# Patient Record
Sex: Male | Born: 1970
Health system: Southern US, Community
[De-identification: ages and names within clinical notes are randomized; demographics above are authoritative.]

## PROBLEM LIST (undated history)

## (undated) DIAGNOSIS — E785 Hyperlipidemia, unspecified: Secondary | ICD-10-CM

## (undated) DIAGNOSIS — G479 Sleep disorder, unspecified: Secondary | ICD-10-CM

## (undated) HISTORY — PX: OTHER SURGICAL HISTORY: SHX169

## (undated) HISTORY — DX: Sleep disorder, unspecified: G47.9

## (undated) HISTORY — DX: Hyperlipidemia, unspecified: E78.5

---

## 2015-06-09 ENCOUNTER — Inpatient Hospital Stay: Payer: 59 | Attending: Internal Medicine | Admitting: Internal Medicine

## 2015-06-09 ENCOUNTER — Inpatient Hospital Stay: Payer: 59

## 2015-06-09 VITALS — BP 133/84 | HR 81 | Temp 98.4°F | Resp 18 | Ht 70.0 in | Wt 227.3 lb

## 2015-06-09 DIAGNOSIS — Z8042 Family history of malignant neoplasm of prostate: Secondary | ICD-10-CM | POA: Insufficient documentation

## 2015-06-09 DIAGNOSIS — D7589 Other specified diseases of blood and blood-forming organs: Secondary | ICD-10-CM

## 2015-06-09 DIAGNOSIS — E785 Hyperlipidemia, unspecified: Secondary | ICD-10-CM | POA: Diagnosis not present

## 2015-06-09 DIAGNOSIS — R0683 Snoring: Secondary | ICD-10-CM | POA: Diagnosis not present

## 2015-06-09 DIAGNOSIS — D751 Secondary polycythemia: Secondary | ICD-10-CM | POA: Insufficient documentation

## 2015-06-09 DIAGNOSIS — R718 Other abnormality of red blood cells: Secondary | ICD-10-CM | POA: Diagnosis not present

## 2015-06-09 LAB — CBC WITH DIFFERENTIAL/PLATELET
BASOS PCT: 1 %
Basophils Absolute: 0.1 10*3/uL (ref 0–0.1)
EOS ABS: 0.1 10*3/uL (ref 0–0.7)
EOS PCT: 2 %
HCT: 45.1 % (ref 40.0–52.0)
HEMOGLOBIN: 14.2 g/dL (ref 13.0–18.0)
LYMPHS ABS: 2 10*3/uL (ref 1.0–3.6)
Lymphocytes Relative: 33 %
MCH: 22.3 pg — AB (ref 26.0–34.0)
MCHC: 31.5 g/dL — AB (ref 32.0–36.0)
MCV: 70.8 fL — ABNORMAL LOW (ref 80.0–100.0)
MONOS PCT: 8 %
Monocytes Absolute: 0.5 10*3/uL (ref 0.2–1.0)
NEUTROS ABS: 3.5 10*3/uL (ref 1.4–6.5)
NEUTROS PCT: 56 %
PLATELETS: 231 10*3/uL (ref 150–440)
RBC: 6.36 MIL/uL — AB (ref 4.40–5.90)
RDW: 16.6 % — ABNORMAL HIGH (ref 11.5–14.5)
WBC: 6.1 10*3/uL (ref 3.8–10.6)

## 2015-06-09 LAB — SAVE SMEAR

## 2015-06-09 NOTE — Progress Notes (Signed)
Pt here as new consult, no sx from abnormal RBC count.  According to the md notes of referring doctor this has been this way for 3 years

## 2015-06-09 NOTE — Progress Notes (Signed)
Cancer Center @ [facility] Telephone:(336) [phone]  Fax:(336) [phone]     SHAHZAD THOMANN OB: 1971/05/10  MR#: [id number]  AOZ#:[id number]  Patient Care Team: Lyndon Code, MD as PCP - General (Internal Medicine)  CHIEF COMPLAINT: No chief complaint on file.  microcytosis   No history exists.    No flowsheet data found.  HISTORY OF PRESENT ILLNESS:   Mr. Sauceda is a 44   year old male, who is referred to our clinic for evaluation of microcytosis. He he feels well, has no complaints, has not had any bleeding from any source, maintains good diet, not a vegetarian. He recalls that approximately 4 years ago he was told that his red blood cells was small, but no particular workup was done. Review of available medical records suggest that his MCV is around 70 fL, with most recent iron profile being within normal range. He is not anemic, but his red blood cell count is elevated. He does not recall anybody in his family suffering from anemia, sickle cell disease, thalassemia.   he snores at nighttime, and will have sleep study within next few weeks.  REVIEW OF SYSTEMS:   Review of Systems  All other systems reviewed and are negative.    PAST MEDICAL HISTORY: Past Medical History  Diagnosis Date  . Hyperlipidemia   . Sleep disorder     PAST SURGICAL HISTORY: History reviewed. No pertinent past surgical history.  FAMILY HISTORY Family History  Problem Relation Age of Onset  . Diabetes Mother   . Thyroid disease Mother   . Prostate cancer Maternal Uncle   . Diabetes Maternal Grandfather    no history of anemia, thalassemia, sickle cell disorder  ADVANCED DIRECTIVES:  No flowsheet data found.  HEALTH MAINTENANCE: Social History  Substance Use Topics  . Smoking status: Never Smoker   . Smokeless tobacco: Never Used  . Alcohol Use: None     No Known Allergies  Current Outpatient Prescriptions  Medication Sig Dispense Refill  . HYDROcodone-homatropine (HYCODAN) 5-1.5 MG/5ML  syrup Take by mouth.     No current facility-administered medications for this visit.    OBJECTIVE:  Filed Vitals:   06/09/15 1056  BP: 133/84  Pulse: 81  Temp: 98.4 F (36.9 C)  Resp: 18     Body mass index is 32.61 kg/(m^2).    ECOG FS:0 - Asymptomatic  Physical Exam  Constitutional: He is oriented to person, place, and time and well-developed, well-nourished, and in no distress. No distress.  African-American male  HENT:  Head: Normocephalic and atraumatic.  Right Ear: External ear normal.  Left Ear: External ear normal.  Nose: Nose normal.  Mouth/Throat: Oropharynx is clear and moist. No oropharyngeal exudate.  Eyes: Conjunctivae and EOM are normal. Pupils are equal, round, and reactive to light. Right eye exhibits no discharge. Left eye exhibits no discharge. No scleral icterus.  Neck: Normal range of motion. Neck supple. No JVD present. No tracheal deviation present. No thyromegaly present.  Cardiovascular: Normal rate, regular rhythm, normal heart sounds and intact distal pulses.  Exam reveals no gallop and no friction rub.   No murmur heard. Pulmonary/Chest: Effort normal and breath sounds normal. No stridor. No respiratory distress. He has no wheezes. He has no rales. He exhibits no tenderness.  Abdominal: Soft. Bowel sounds are normal. He exhibits no distension and no mass. There is no tenderness. There is no rebound and no guarding.  Genitourinary:  Patient deferred  Musculoskeletal: Normal range of motion. He exhibits no  edema or tenderness.  Lymphadenopathy:    He has no cervical adenopathy.  Neurological: He is alert and oriented to person, place, and time. He has normal reflexes. No cranial nerve deficit. He exhibits normal muscle tone. Gait normal. Coordination normal. GCS score is 15.  Skin: Skin is warm and dry. No rash noted. He is not diaphoretic. No erythema. No pallor.  Psychiatric: Mood, memory, affect and judgment normal.  Nursing note and vitals  reviewed.    LAB RESULTS:  Recent Results (from the past 2160 hour(s))  CBC with Differential/Platelet     Status: Abnormal   Collection Time: 06/09/15 11:30 AM  Result Value Ref Range   WBC 6.1 3.8 - 10.6 K/uL   RBC 6.36 (H) 4.40 - 5.90 MIL/uL   Hemoglobin 14.2 13.0 - 18.0 g/dL   HCT 16.145.1 09.640.0 - 04.552.0 %   MCV 70.8 (L) 80.0 - 100.0 fL   MCH 22.3 (L) 26.0 - 34.0 pg   MCHC 31.5 (L) 32.0 - 36.0 g/dL   RDW 40.916.6 (H) 81.111.5 - 91.414.5 %   Platelets 231 150 - 440 K/uL   Neutrophils Relative % 56 %   Neutro Abs 3.5 1.4 - 6.5 K/uL   Lymphocytes Relative 33 %   Lymphs Abs 2.0 1.0 - 3.6 K/uL   Monocytes Relative 8 %   Monocytes Absolute 0.5 0.2 - 1.0 K/uL   Eosinophils Relative 2 %   Eosinophils Absolute 0.1 0 - 0.7 K/uL   Basophils Relative 1 %   Basophils Absolute 0.1 0 - 0.1 K/uL     ASSESSMENT AND MEDICAL DECISION MAKING:   microcytosis-the patient is currently completely asymptomatic, with the only abnormality being microcytosis with MCV of 70.8, and mild erythrocytosis with red blood cell count of 6.36, but normal hemoglobin. Most likely cause of microcytosis in this case is thalassemia trait, likely beta thalassemia. We will check hemoglobin electrophoresis, which should give us a hint of weight for symmetric if it is present area if it were to return is normal, we will check Mr. Jennette Kettleeal for alpha thalassemia. We will also check copper, zinc and lead levels, which on rare occasions could cause microcytosis in the absence of anemia. He'll return to our clinic in 2 weeks.   Patient expressed understanding and was in agreement with this plan. He also understands that He can call clinic at any time with any questions, concerns, or complaints.    No matching staging information was found for the patient.  Gorden Harmsmitriy Prentiss Polio, MD   06/09/2015 10:29 AM

## 2015-06-12 LAB — COPPER, SERUM: COPPER: 90 ug/dL (ref 72–166)

## 2015-06-12 LAB — HEMOGLOBINOPATHY EVALUATION
HGB C: 0 %
Hgb A2 Quant: 6.3 % — ABNORMAL HIGH (ref 0.7–3.1)
Hgb A: 92.2 % — ABNORMAL LOW (ref 94.0–98.0)
Hgb F Quant: 1.5 % (ref 0.0–2.0)
Hgb S Quant: 0 %

## 2015-06-12 LAB — LEAD, BLOOD (ADULT >= 16 YRS): Lead-Whole Blood: NOT DETECTED ug/dL (ref 0–19)

## 2015-06-12 LAB — ZINC: Zinc: 74 ug/dL (ref 56–134)

## 2015-06-23 ENCOUNTER — Inpatient Hospital Stay: Payer: 59

## 2015-07-07 ENCOUNTER — Inpatient Hospital Stay: Payer: 59 | Attending: Internal Medicine | Admitting: Internal Medicine

## 2015-07-07 ENCOUNTER — Encounter: Payer: Self-pay | Admitting: Internal Medicine

## 2015-07-07 VITALS — BP 148/88 | HR 85 | Temp 97.7°F | Resp 18 | Wt 223.1 lb

## 2015-07-07 DIAGNOSIS — D7589 Other specified diseases of blood and blood-forming organs: Secondary | ICD-10-CM | POA: Insufficient documentation

## 2015-07-07 DIAGNOSIS — G479 Sleep disorder, unspecified: Secondary | ICD-10-CM | POA: Diagnosis not present

## 2015-07-07 DIAGNOSIS — D563 Thalassemia minor: Secondary | ICD-10-CM | POA: Diagnosis not present

## 2015-07-07 DIAGNOSIS — E785 Hyperlipidemia, unspecified: Secondary | ICD-10-CM

## 2015-07-07 DIAGNOSIS — Z8042 Family history of malignant neoplasm of prostate: Secondary | ICD-10-CM | POA: Diagnosis not present

## 2015-07-07 DIAGNOSIS — Z79899 Other long term (current) drug therapy: Secondary | ICD-10-CM | POA: Diagnosis not present

## 2015-07-07 NOTE — Progress Notes (Signed)
[facility] @ [facility] Telephone:(336) [phone]  Fax:(336) [phone]     Noah DrainDouglas J Cummings OB: 05/13/1971  MR#: [id number]  EXB#:[id number]#:[id number]  Patient Care Team: Lyndon CodeFozia M Khan, MD as PCP - General (Internal Medicine)  CHIEF COMPLAINT:  Chief Complaint  Patient presents with  . macrocytosis   microcytosis, beta thalassemia trait   No history exists.    No flowsheet data found.  HISTORY OF PRESENT ILLNESS:   Noah Cummings is a 45   year old male, who is referred to our clinic for evaluation of microcytosis. He he feels well, has no complaints, has not had any bleeding from any source, maintains good diet, not a vegetarian. He recalls that approximately 4 years ago he was told that his red blood cells was small, but no particular workup was done. Review of available medical records suggest that his MCV is around 70 fL, with most recent iron profile being within normal range. He is not anemic, but his red blood cell count is elevated. He does not recall anybody in his family suffering from anemia, sickle cell disease, thalassemia.   he snores at nighttime, and will have sleep study within next few weeks.  Current status: Noah Cummings returns to our clinic for a follow-up visit to discuss the results of the workup. He does not have any complaints at this time.  REVIEW OF SYSTEMS:   Review of Systems  All other systems reviewed and are negative.    PAST MEDICAL HISTORY: Past Medical History  Diagnosis Date  . Hyperlipidemia   . Sleep disorder     PAST SURGICAL HISTORY: History reviewed. No pertinent past surgical history.  FAMILY HISTORY Family History  Problem Relation Age of Onset  . Diabetes Mother   . Thyroid disease Mother   . Prostate cancer Maternal Uncle   . Diabetes Maternal Grandfather    no history of anemia, thalassemia, sickle cell disorder  ADVANCED DIRECTIVES:  No flowsheet data found.  HEALTH MAINTENANCE: Social History  Substance Use Topics  . Smoking status: Never  Smoker   . Smokeless tobacco: Never Used  . Alcohol Use: None     No Known Allergies  Current Outpatient Prescriptions  Medication Sig Dispense Refill  . chlorpheniramine-HYDROcodone (TUSSIONEX) 10-8 MG/5ML SUER Take 5 mLs by mouth 2 (two) times daily.    Marland Kitchen. doxycycline (VIBRAMYCIN) 100 MG capsule Take 1 capsule by mouth 2 (two) times daily.    . predniSONE (DELTASONE) 5 MG tablet Taper 6-5-4-3-2-1     No current facility-administered medications for this visit.    OBJECTIVE:  Filed Vitals:   07/07/15 1116  BP: 148/88  Pulse: 85  Temp: 97.7 F (36.5 C)  Resp: 18     Body mass index is 32.01 kg/(m^2).    ECOG FS:0 - Asymptomatic  Physical Exam  Constitutional: He is oriented to person, place, and time and well-developed, well-nourished, and in no distress. No distress.  African-American male  HENT:  Head: Normocephalic and atraumatic.  Right Ear: External ear normal.  Left Ear: External ear normal.  Nose: Nose normal.  Mouth/Throat: Oropharynx is clear and moist. No oropharyngeal exudate.  Eyes: Conjunctivae and EOM are normal. Pupils are equal, round, and reactive to light. Right eye exhibits no discharge. Left eye exhibits no discharge. No scleral icterus.  Neck: Normal range of motion. Neck supple. No JVD present. No tracheal deviation present. No thyromegaly present.  Cardiovascular: Normal rate, regular rhythm, normal heart sounds and intact distal pulses.  Exam reveals no  gallop and no friction rub.   No murmur heard. Pulmonary/Chest: Effort normal and breath sounds normal. No stridor. No respiratory distress. He has no wheezes. He has no rales. He exhibits no tenderness.  Abdominal: Soft. Bowel sounds are normal. He exhibits no distension and no mass. There is no tenderness. There is no rebound and no guarding.  Genitourinary:  Patient deferred  Musculoskeletal: Normal range of motion. He exhibits no edema or tenderness.  Lymphadenopathy:    He has no cervical  adenopathy.  Neurological: He is alert and oriented to person, place, and time. He has normal reflexes. No cranial nerve deficit. He exhibits normal muscle tone. Gait normal. Coordination normal. GCS score is 15.  Skin: Skin is warm and dry. No rash noted. He is not diaphoretic. No erythema. No pallor.  Psychiatric: Mood, memory, affect and judgment normal.  Nursing note and vitals reviewed.    LAB RESULTS:  Recent Results (from the past 2160 hour(s))  Lead, blood (adult age 50 yrs or greater)     Status: None   Collection Time: 06/09/15 11:30 AM  Result Value Ref Range   Lead-Whole Blood None Detected 0 - 19 ug/dL    Comment: (NOTE)                          Environmental Exposure:                           WHO Recommendation    <20                          Occupational Exposure:                           OSHA Lead Std          40                           BEI                    30                                Detection Limit =  1 Performed At: [facility] [address] [place], [place] [id number] Mila Homer MD WU:[id number]   Copper, serum     Status: None   Collection Time: 06/09/15 11:30 AM  Result Value Ref Range   Copper 90 72 - 166 ug/dL    Comment: (NOTE)                                Detection Limit = 5 Performed At: [facility] [address] [place], [place] [id number] Mila Homer MD VH:[id number]   Save smear     Status: None   Collection Time: 06/09/15 11:30 AM  Result Value Ref Range   Smear Review SMEAR STAINED AND AVAILABLE FOR REVIEW   CBC with Differential/Platelet     Status: Abnormal   Collection Time: 06/09/15 11:30 AM  Result Value Ref Range   WBC 6.1 3.8 - 10.6 K/uL   RBC 6.36 (H) 4.40 - 5.90 MIL/uL   Hemoglobin 14.2 13.0 - 18.0 g/dL   HCT  45.1 40.0 - 52.0 %   MCV 70.8 (L) 80.0 - 100.0 fL   MCH 22.3 (L) 26.0 - 34.0 pg   MCHC 31.5 (L) 32.0 - 36.0 g/dL   RDW 95.6 (H) 21.3 - 08.6 %   Platelets 231 150 - 440 K/uL    Neutrophils Relative % 56 %   Neutro Abs 3.5 1.4 - 6.5 K/uL   Lymphocytes Relative 33 %   Lymphs Abs 2.0 1.0 - 3.6 K/uL   Monocytes Relative 8 %   Monocytes Absolute 0.5 0.2 - 1.0 K/uL   Eosinophils Relative 2 %   Eosinophils Absolute 0.1 0 - 0.7 K/uL   Basophils Relative 1 %   Basophils Absolute 0.1 0 - 0.1 K/uL  Hemoglobinopathy evaluation     Status: Abnormal   Collection Time: 06/09/15 11:30 AM  Result Value Ref Range   Hgb A2 Quant 6.3 (H) 0.7 - 3.1 %   Hgb F Quant 1.5 0.0 - 2.0 %   Hgb S Quant 0.0 0.0 %   Hgb C 0.0 0.0 %   Hgb A 92.2 (L) 94.0 - 98.0 %   Please Note: Comment     Comment: (NOTE) Hemoglobin pattern and concentrations are consistent with beta- Thalassemia minor. Suggest hematologic and clinical correlation. Performed At: [facility] [address] [place], [place] [id number] Mila Homer MD BM:[id number]   Zinc     Status: None   Collection Time: 06/09/15 11:30 AM  Result Value Ref Range   Zinc 74 56 - 134 ug/dL    Comment: (NOTE)                                Detection Limit = 5 Performed At: [facility] [address] [place], [place] [id number] Mila Homer MD IH:[id number]      ASSESSMENT AND MEDICAL DECISION MAKING:  Beta thalassemia trait-this we discussed during the first visit, the workup was consistent with beta thalassemia trait AKA beta thalassemia minor. We had a discussion with Mr. Coulthard, during which we underlined the fact that such a diagnosis does not carry any health-related consequences for him, and does not require any treatment or monitoring. However, this diagnosis has some implications for his 5 children. We discussed the genetics of beta thalassemia, and explained to Mr. Coward that his children have a 50% chance of acquiring beta thalassemia trait from him if his partner does not have any blood abnormalities, and have a 25% chance of inheriting 2 defective genes from the parents if both of them have beta  thalassemia trait, as such developing a full-blown thalassemia. There is also a possibility that if Mr. Torbert partner had another hemoglobinopathy or sickling disorder, a different combination could appear him on his children. He expressed understanding of these concepts, and stated that he would discuss this information with his children and his children's mothers, to decide on whether any additional evaluation of his children is needed.  He, however, does not require any follow-up appointment with [place].  Patient expressed understanding and was in agreement with this plan. He also understands that He can call clinic at any time with any questions, concerns, or complaints.    No matching staging information was found for the patient.  Gorden Harms, MD   07/07/2015 12:37 PM

## 2016-07-31 DIAGNOSIS — R030 Elevated blood-pressure reading, without diagnosis of hypertension: Secondary | ICD-10-CM | POA: Diagnosis not present

## 2016-07-31 DIAGNOSIS — Z0001 Encounter for general adult medical examination with abnormal findings: Secondary | ICD-10-CM | POA: Diagnosis not present

## 2016-08-18 ENCOUNTER — Encounter: Payer: Self-pay | Admitting: Emergency Medicine

## 2016-08-18 ENCOUNTER — Emergency Department
Admission: EM | Admit: 2016-08-18 | Discharge: 2016-08-19 | Disposition: A | Discharge status: Home / Self Care | Payer: 59 | Attending: Emergency Medicine | Admitting: Emergency Medicine

## 2016-08-18 DIAGNOSIS — X500XXA Overexertion from strenuous movement or load, initial encounter: Secondary | ICD-10-CM | POA: Diagnosis not present

## 2016-08-18 DIAGNOSIS — S39012A Strain of muscle, fascia and tendon of lower back, initial encounter: Secondary | ICD-10-CM

## 2016-08-18 DIAGNOSIS — Y9389 Activity, other specified: Secondary | ICD-10-CM | POA: Diagnosis not present

## 2016-08-18 DIAGNOSIS — Y929 Unspecified place or not applicable: Secondary | ICD-10-CM | POA: Diagnosis not present

## 2016-08-18 DIAGNOSIS — R0789 Other chest pain: Secondary | ICD-10-CM | POA: Insufficient documentation

## 2016-08-18 DIAGNOSIS — S3992XA Unspecified injury of lower back, initial encounter: Secondary | ICD-10-CM | POA: Diagnosis present

## 2016-08-18 DIAGNOSIS — Y990 Civilian activity done for income or pay: Secondary | ICD-10-CM | POA: Diagnosis not present

## 2016-08-18 DIAGNOSIS — R079 Chest pain, unspecified: Secondary | ICD-10-CM

## 2016-08-18 MED ORDER — ORPHENADRINE CITRATE 30 MG/ML IJ SOLN
60.0000 mg | INTRAMUSCULAR | Status: AC
  Administered 2016-08-18: 60 mg via INTRAMUSCULAR
  Filled 2016-08-18: qty 2

## 2016-08-18 MED ORDER — KETOROLAC TROMETHAMINE 60 MG/2ML IM SOLN
30.0000 mg | Freq: Once | INTRAMUSCULAR | Status: AC
  Administered 2016-08-18: 30 mg via INTRAMUSCULAR
  Filled 2016-08-18: qty 2

## 2016-08-18 NOTE — ED Triage Notes (Signed)
Pt c/o low back pain since Wednesday that is progressively worsening; denies injury; pt says he does heavy lifting and repetitive movement at his job and was working when pain started; denies urinary s/s;

## 2016-08-18 NOTE — ED Notes (Signed)
Pt states approx 1 week ago began having lower back tightness while at work, states pain has progressively gotten worse. Pt states for work he lifts boxes and has repeated motions involving his lower back.

## 2016-08-19 ENCOUNTER — Emergency Department: Payer: 59

## 2016-08-19 DIAGNOSIS — R0789 Other chest pain: Secondary | ICD-10-CM | POA: Diagnosis not present

## 2016-08-19 LAB — BASIC METABOLIC PANEL
ANION GAP: 6 (ref 5–15)
BUN: 15 mg/dL (ref 6–20)
CO2: 27 mmol/L (ref 22–32)
Calcium: 8.8 mg/dL — ABNORMAL LOW (ref 8.9–10.3)
Chloride: 102 mmol/L (ref 101–111)
Creatinine, Ser: 1.36 mg/dL — ABNORMAL HIGH (ref 0.61–1.24)
GFR calc Af Amer: >60 mL/min (ref >60)
GFR calc non Af Amer: >60 mL/min (ref >60)
GLUCOSE: 102 mg/dL — AB (ref 65–99)
POTASSIUM: 4.2 mmol/L (ref 3.5–5.1)
Sodium: 135 mmol/L (ref 135–145)

## 2016-08-19 LAB — CBC
HEMATOCRIT: 41.7 % (ref 40.0–52.0)
HEMOGLOBIN: 13.8 g/dL (ref 13.0–18.0)
MCH: 23.2 pg — AB (ref 26.0–34.0)
MCHC: 33 g/dL (ref 32.0–36.0)
MCV: 70.2 fL — AB (ref 80.0–100.0)
Platelets: 225 10*3/uL (ref 150–440)
RBC: 5.94 MIL/uL — ABNORMAL HIGH (ref 4.40–5.90)
RDW: 16.2 % — ABNORMAL HIGH (ref 11.5–14.5)
WBC: 7.3 10*3/uL (ref 3.8–10.6)

## 2016-08-19 LAB — TROPONIN I: Troponin I: <0.03 ng/mL (ref <0.03)

## 2016-08-19 MED ORDER — OXYCODONE-ACETAMINOPHEN 5-325 MG PO TABS
1.0000 | ORAL_TABLET | Freq: Once | ORAL | Status: AC
  Administered 2016-08-19: 1 via ORAL
  Filled 2016-08-19: qty 1

## 2016-08-19 MED ORDER — IBUPROFEN 800 MG PO TABS: 800.0000 mg | ORAL_TABLET | Freq: Three times a day (TID) | ORAL | 0 refills | Status: DC | PRN

## 2016-08-19 MED ORDER — HYDROCODONE-ACETAMINOPHEN 5-325 MG PO TABS: 1.0000 | ORAL_TABLET | Freq: Four times a day (QID) | ORAL | 0 refills | Status: DC | PRN

## 2016-08-19 MED ORDER — LIDOCAINE 5 % EX PTCH: 1.0000 | MEDICATED_PATCH | CUTANEOUS | 0 refills | Status: DC

## 2016-08-19 MED ORDER — LIDOCAINE 5 % EX PTCH
1.0000 | MEDICATED_PATCH | CUTANEOUS | Status: DC
  Administered 2016-08-19: 1 via TRANSDERMAL
  Filled 2016-08-19: qty 1

## 2016-08-19 MED ORDER — DIAZEPAM 2 MG PO TABS: 2.0000 mg | ORAL_TABLET | Freq: Three times a day (TID) | ORAL | 0 refills | Status: DC | PRN

## 2016-08-19 MED ORDER — HYDROCODONE-ACETAMINOPHEN 5-325 MG PO TABS
1.0000 | ORAL_TABLET | Freq: Once | ORAL | Status: AC
  Administered 2016-08-19: 1 via ORAL
  Filled 2016-08-19: qty 1

## 2016-08-19 NOTE — ED Provider Notes (Signed)
-----------------------------------------   1:05 AM on 08/19/2016 -----------------------------------------  Assumed care of patient who presents with lumbar spasms who developed chest pain after administration of IM Norflex and Toradol. Resting with no acute distress. Physical exam demonstrates bilateral lumbar muscle spasms without focal neurological deficits. Pain on straight leg raise bilaterally at 45. Awaiting cardiac workup results.  ----------------------------------------- 3:17 AM on 08/19/2016 -----------------------------------------  Updated patient and his wife of laboratory and imaging results. We'll discharge home on a regimen of NSAIDs, analgesia, muscle relaxer and lidocaine patch. He will follow-up with orthopedics this coming week. Strict return precautions given. Both verbalize understanding and agree with plan of care.   Irean HongJade J Sung, MD 08/19/16 [phone]

## 2016-08-19 NOTE — ED Notes (Signed)
Report to rebecca, rn.  

## 2016-08-19 NOTE — Discharge Instructions (Signed)
1. You may take medicines as needed for pain and muscle spasms (Motrin/Norco/Valium #15). 2. You may use lidocaine patch as needed for discomfort. 3. Apply moist heat to affected area several times daily. You may apply this over the lidocaine patch. 4. Return to the ER for worsening symptoms, persistent vomiting, leg weakness, losing control of your bowel or bladder, or other concerns.

## 2016-08-19 NOTE — ED Notes (Signed)
In to give pt percocet and discharge. Pt states he is now having chest pressure and upper abd pressure. Pt appears in no acute distress. ekg in progress. Tomasa BlaseBacon, pa notified.

## 2016-08-19 NOTE — ED Provider Notes (Signed)
[facility] Emergency Department Provider Note ____________________________________________  Time seen: 2305  I have reviewed the triage vital signs and the nursing notes.  HISTORY  Chief Complaint  Back Pain  HPI Noah Cummings is a 46 y.o. male presents to the ED for evaluation of acute bilateral lower back pain, with onset on Wednesday. He has worked as a Estate agent since onset, but is unaware of any other activities other than work, that could account for his symptoms. He report back pain and tightness with radiation around his waist to his bilateral anterior thighs. He denies referral of his symptoms into the groin, lower legs, and denies distal paresthesias, foot drop, or incontinence. He denies a history of ongoing or chronic back pain. He denies relief with antiinflammatories or muscle relaxants.   Past Medical History:  Diagnosis Date  . Hyperlipidemia   . Sleep disorder     Patient Active Problem List   Diagnosis Date Noted  . Beta thalassemia trait 07/07/2015    History reviewed. No pertinent surgical history.  Prior to Admission medications   Not on File    Allergies Patient has no known allergies.  Family History  Problem Relation Age of Onset  . Diabetes Mother   . Thyroid disease Mother   . Prostate cancer Maternal Uncle   . Diabetes Maternal Grandfather     Social History Social History  Substance Use Topics  . Smoking status: Never Smoker  . Smokeless tobacco: Never Used  . Alcohol use No    Review of Systems  Constitutional: Negative for fever. Cardiovascular: Negative for chest pain. Respiratory: Negative for shortness of breath. Gastrointestinal: Negative for abdominal pain, vomiting and diarrhea. Genitourinary: Negative for dysuria. Musculoskeletal: Positive for back pain. Skin: Negative for rash. Neurological: Negative for headaches, focal weakness or  numbness. ____________________________________________  PHYSICAL EXAM:  VITAL SIGNS: ED Triage Vitals  Enc Vitals Group     BP 08/18/16 2221 (!) 142/95     Pulse Rate 08/18/16 2221 67     Resp 08/18/16 2221 18     Temp 08/18/16 2221 98.4 F (36.9 C)     Temp Source 08/18/16 2221 Oral     SpO2 08/18/16 2221 99 %     Weight 08/18/16 2225 220 lb (99.8 kg)     Height 08/18/16 2225 5\' 10"  (1.778 m)     Head Circumference --      Peak Flow --      Pain Score 08/18/16 2225 10     Pain Loc --      Pain Edu? --      Excl. in GC? --     Constitutional: Alert and oriented. Well appearing and in no distress. Head: Normocephalic and atraumatic. Cardiovascular: Normal rate, regular rhythm. Normal distal pulses. Respiratory: Normal respiratory effort. No wheezes/rales/rhonchi. Gastrointestinal: Soft and nontender. No distention. Musculoskeletal: Normal spinal alignment without midline tenderness, spasm, deformity, step-off. Patient with tenderness to palpation to the bilateral lumbar sacral paraspinal musculature. He is able to demonstrate normal transition from sit to stand. He has normal hip flexion bilaterally. Nontender with normal range of motion in all extremities.  Neurologic:  Antalgic gait without ataxia. Cranial nerves II through XII grossly intact. Normal LE DTRs bilaterally. Normal toe dorsiflexion and foot eversion bilaterally. Normal speech and language. No gross focal neurologic deficits are appreciated. Skin:  Skin is warm, dry and intact. No rash noted. ____________________________________________  PROCEDURES  Toradol 30 mg IM Norflex 60 mg IM Oxycodone 5-325  mg i PO ____________________________________________  INITIAL IMPRESSION / ASSESSMENT AND PLAN / ED COURSE  .----------------------------------------- 12:55 AM on 08/19/2016 -----------------------------------------  Patient with what appears to be musculoskeletal low back pain without murmurs without deficit.  He was set for discharge when he reported anterior chest wall pain to his nurse, with onset since being in the ED. He denies any nausea, vomiting, shortness of breath. Patient is 46 year old African-American male with initial presentation for acute low back pain to the ED. Patient admits to onset of anterior chest wall pain and as such protocols for chest pain have been injured. His care was transferred to my attending provider at this time. ____________________________________________  FINAL CLINICAL IMPRESSION(S) / ED DIAGNOSES  Final diagnoses:  Strain of lumbar region, initial encounter  Chest pain, unspecified type      Lissa HoardJenise V Bacon Davinity Fanara, PA-C 08/19/16 0105    Willy EddyPatrick Robinson, MD 08/19/16 1039

## 2016-08-23 DIAGNOSIS — M545 Low back pain: Secondary | ICD-10-CM | POA: Diagnosis not present

## 2016-08-23 DIAGNOSIS — R030 Elevated blood-pressure reading, without diagnosis of hypertension: Secondary | ICD-10-CM | POA: Diagnosis not present

## 2016-08-27 DIAGNOSIS — M4306 Spondylolysis, lumbar region: Secondary | ICD-10-CM | POA: Diagnosis not present

## 2016-08-27 DIAGNOSIS — M545 Low back pain, unspecified: Secondary | ICD-10-CM | POA: Insufficient documentation

## 2016-08-28 DIAGNOSIS — M4306 Spondylolysis, lumbar region: Secondary | ICD-10-CM | POA: Diagnosis not present

## 2016-09-05 ENCOUNTER — Other Ambulatory Visit: Payer: Self-pay | Admitting: Orthopedic Surgery

## 2016-09-05 DIAGNOSIS — M4316 Spondylolisthesis, lumbar region: Secondary | ICD-10-CM

## 2016-09-19 ENCOUNTER — Ambulatory Visit: Payer: 59

## 2016-10-01 ENCOUNTER — Ambulatory Visit
Admission: RE | Admit: 2016-10-01 | Discharge: 2016-10-01 | Disposition: A | Discharge status: Home / Self Care | Payer: 59 | Source: Ambulatory Visit | Attending: Orthopedic Surgery | Admitting: Orthopedic Surgery

## 2016-10-01 ENCOUNTER — Other Ambulatory Visit: Payer: Self-pay | Admitting: Orthopedic Surgery

## 2016-10-01 DIAGNOSIS — Z1389 Encounter for screening for other disorder: Secondary | ICD-10-CM

## 2016-10-01 DIAGNOSIS — M5126 Other intervertebral disc displacement, lumbar region: Secondary | ICD-10-CM | POA: Insufficient documentation

## 2016-10-01 DIAGNOSIS — M5127 Other intervertebral disc displacement, lumbosacral region: Secondary | ICD-10-CM | POA: Insufficient documentation

## 2016-10-01 DIAGNOSIS — E882 Lipomatosis, not elsewhere classified: Secondary | ICD-10-CM | POA: Diagnosis not present

## 2016-10-01 DIAGNOSIS — M48061 Spinal stenosis, lumbar region without neurogenic claudication: Secondary | ICD-10-CM | POA: Diagnosis not present

## 2016-10-01 DIAGNOSIS — Z0389 Encounter for observation for other suspected diseases and conditions ruled out: Secondary | ICD-10-CM | POA: Diagnosis not present

## 2016-10-01 DIAGNOSIS — M4316 Spondylolisthesis, lumbar region: Secondary | ICD-10-CM | POA: Diagnosis not present

## 2016-10-07 DIAGNOSIS — M545 Low back pain: Secondary | ICD-10-CM | POA: Diagnosis not present

## 2016-10-07 DIAGNOSIS — M4306 Spondylolysis, lumbar region: Secondary | ICD-10-CM | POA: Diagnosis not present

## 2016-10-18 DIAGNOSIS — M545 Low back pain: Secondary | ICD-10-CM | POA: Diagnosis not present

## 2016-10-18 DIAGNOSIS — S39012S Strain of muscle, fascia and tendon of lower back, sequela: Secondary | ICD-10-CM | POA: Diagnosis not present

## 2016-10-25 DIAGNOSIS — M545 Low back pain: Secondary | ICD-10-CM | POA: Diagnosis not present

## 2016-10-25 DIAGNOSIS — S39012S Strain of muscle, fascia and tendon of lower back, sequela: Secondary | ICD-10-CM | POA: Diagnosis not present

## 2016-11-01 DIAGNOSIS — M5416 Radiculopathy, lumbar region: Secondary | ICD-10-CM | POA: Diagnosis not present

## 2016-11-01 DIAGNOSIS — M545 Low back pain: Secondary | ICD-10-CM | POA: Diagnosis not present

## 2016-11-08 DIAGNOSIS — M545 Low back pain: Secondary | ICD-10-CM | POA: Diagnosis not present

## 2016-11-08 DIAGNOSIS — S39012S Strain of muscle, fascia and tendon of lower back, sequela: Secondary | ICD-10-CM | POA: Diagnosis not present

## 2016-11-18 DIAGNOSIS — S39012S Strain of muscle, fascia and tendon of lower back, sequela: Secondary | ICD-10-CM | POA: Diagnosis not present

## 2016-11-18 DIAGNOSIS — M545 Low back pain: Secondary | ICD-10-CM | POA: Diagnosis not present

## 2017-04-22 IMAGING — MR MR LUMBAR SPINE W/O CM
5 series · 37 of 48 positions shown · non-contrast
Comparison: None.

CLINICAL DATA: Low back and right leg pain for 1 month.

EXAM:
MRI LUMBAR SPINE WITHOUT CONTRAST
TECHNIQUE: Multiplanar, multisequence MR imaging of the lumbar spine was
performed. No intravenous contrast was administered.

[Series 4: T2 · sagittal · 4.0mm · 0.81mm/px · 6 of 15 slices shown (1 of 2)]
[im 1/15]
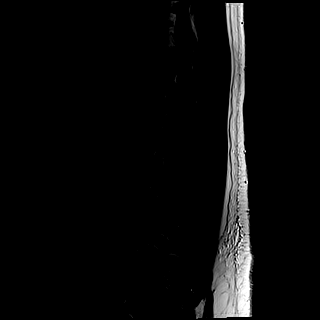
[im 3/15]
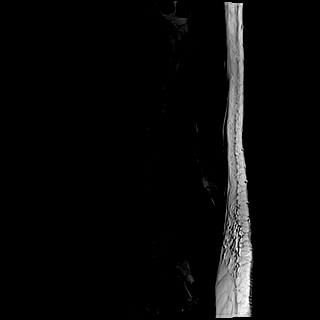
[im 6/15]
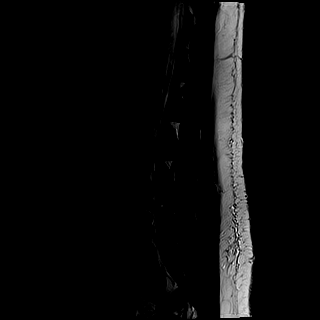
[im 9/15]
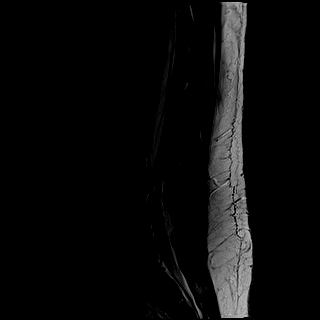
[im 12/15]
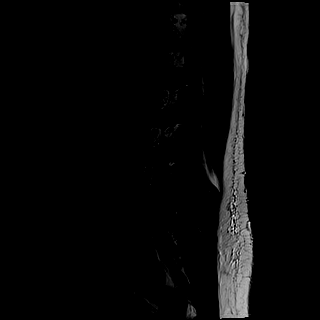
[im 15/15]
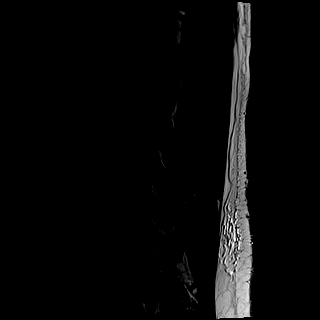

[Series 5: T1 · sagittal · 4.0mm · 0.81mm/px · 6 of 15 slices shown (1 of 2)]
[im 1/15]
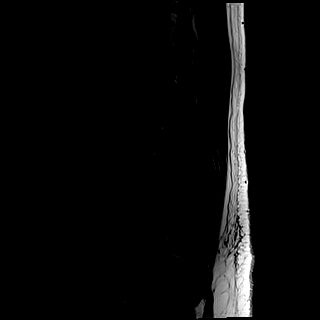
[im 3/15]
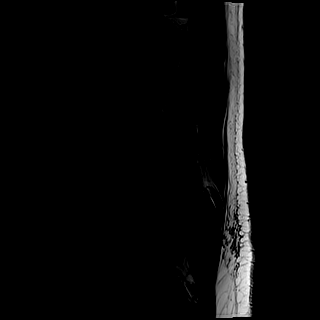
[im 6/15]
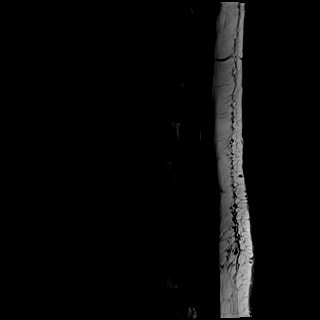
[im 9/15]
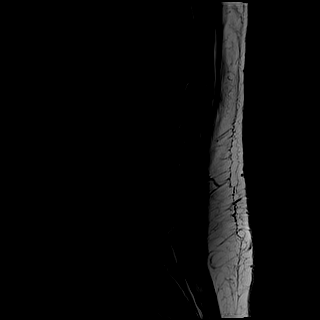
[im 12/15]
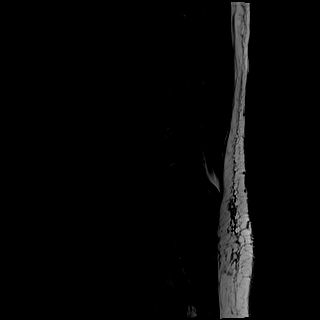
[im 15/15]
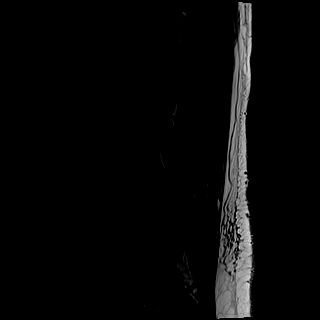

[Series 6: STIR · sagittal · 4.0mm · 1.02mm/px · 6 of 15 slices shown]
[im 1/15]
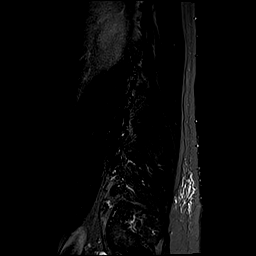
[im 3/15]
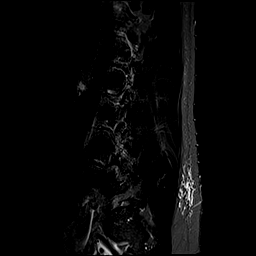
[im 6/15]
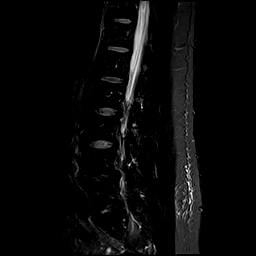
[im 9/15]
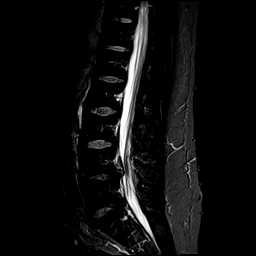
[im 12/15]
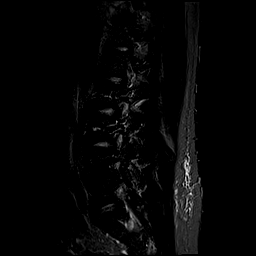
[im 15/15]
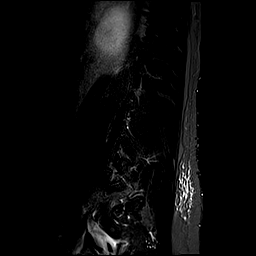

[Series 7: T2 · axial · 4.0mm · 0.78mm/px · z∈[-19,+192]mm · 10 of 38 slices shown (2 of 2)]
[im 1/38]
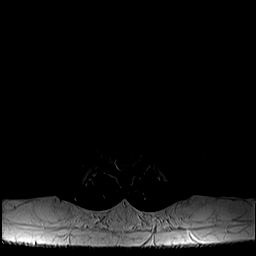
[im 3/38]
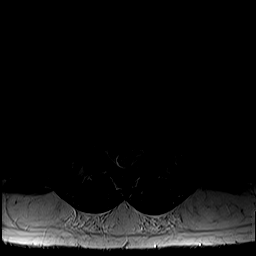
[im 6/38]
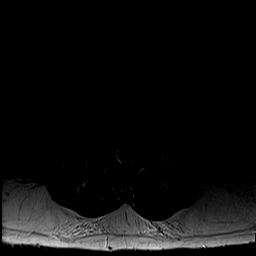
[im 11/38]
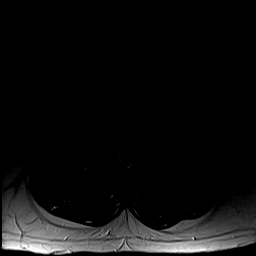
[im 16/38]
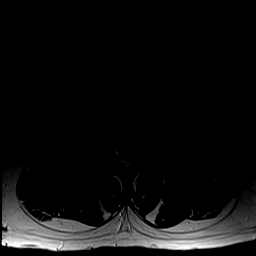
[im 19/38]
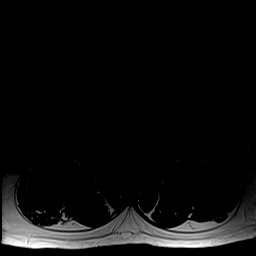
[im 22/38]
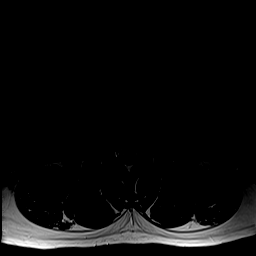
[im 27/38]
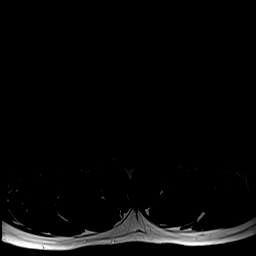
[im 32/38]
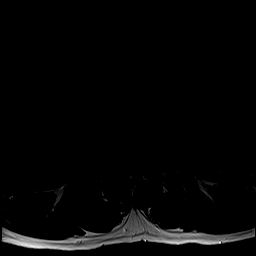
[im 38/38]
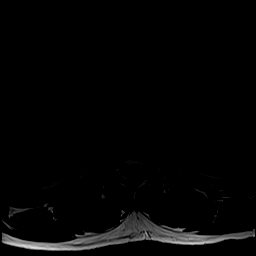

[Series 8: T1 · axial · 4.0mm · 0.39mm/px · z∈[-19,+192]mm · 9 of 38 slices shown (2 of 2)]
[im 1/38]
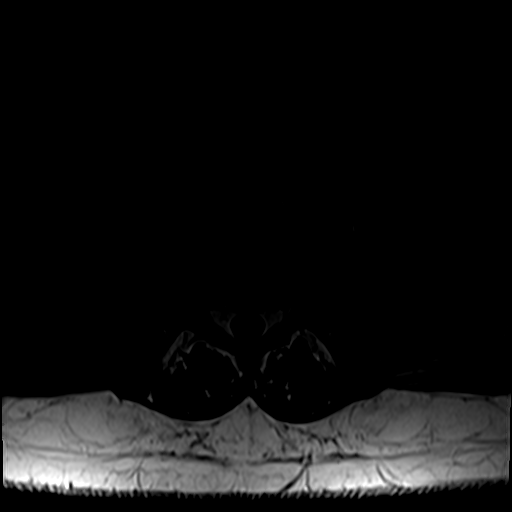
[im 6/38]
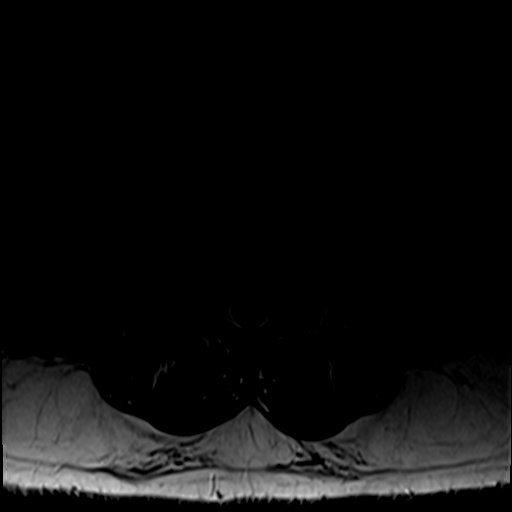
[im 11/38]
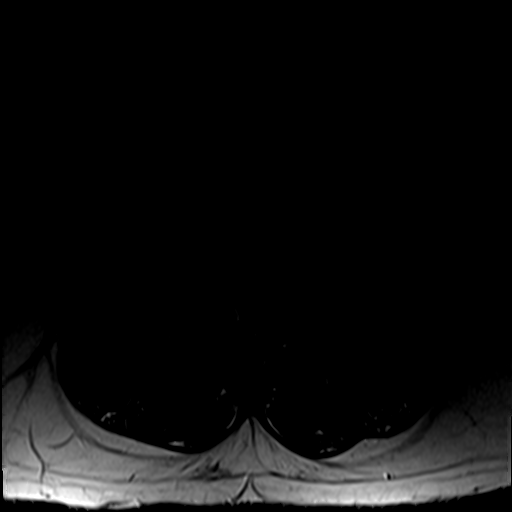
[im 16/38]
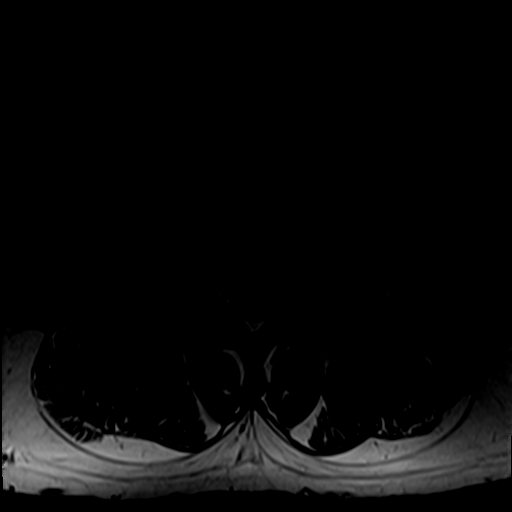
[im 19/38]
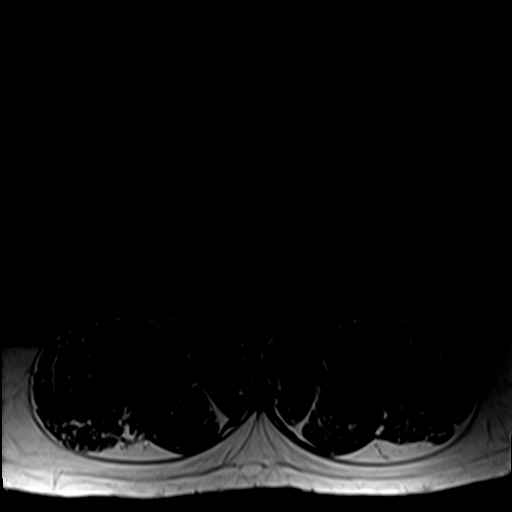
[im 22/38]
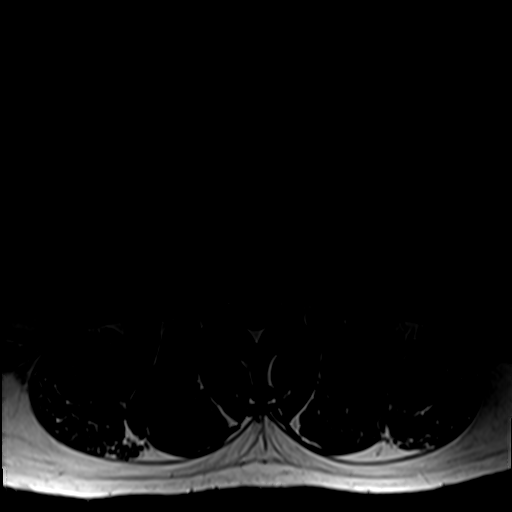
[im 27/38]
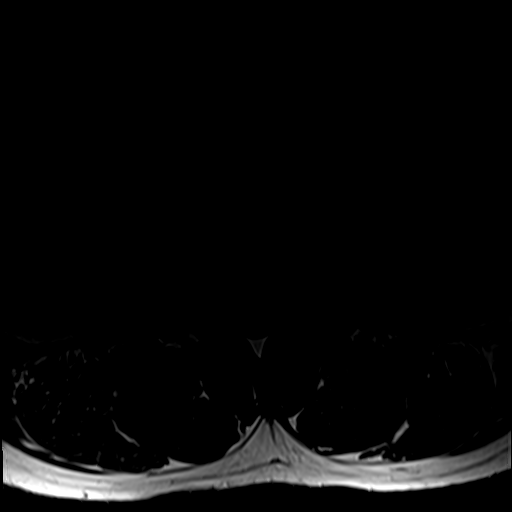
[im 32/38]
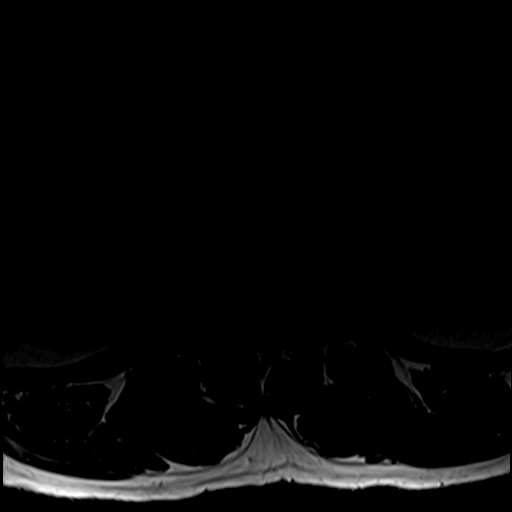
[im 38/38]
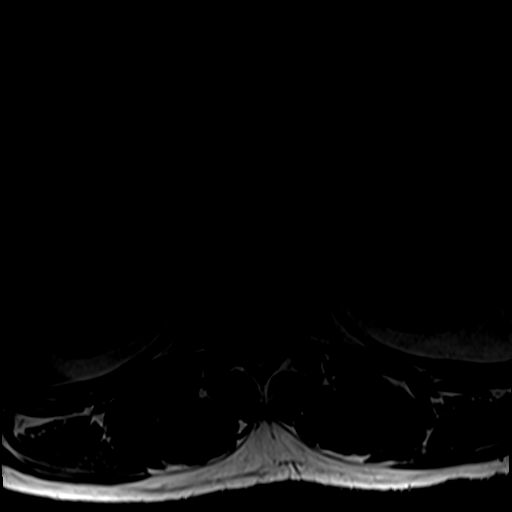

[37 of 48 positions shown; findings below may reference images not displayed]

FINDINGS: Segmentation: 5 lumbar type vertebral bodies. The last full
intervertebral disc space is labeled L5-S1.

Alignment:  Normal

Vertebrae:  Normal wall marrow signal.  No bone lesions or fracture.

Conus medullaris: Extends to the T12-L1 level and appears normal.

Paraspinal and other soft tissues: No significant findings.

Disc levels:

T12-L1:  No significant findings.

L1-2:  No significant findings.

L2-3:  No significant findings.

L3-4: No significant findings.  Mild facet disease.

L4-5: Focal central disc protrusion with mass effect on the ventral
thecal sac. There is also mild epidural lipomatosis and the
combination creates mild spinal and bilateral lateral recess
stenosis. Mild foraminal encroachment bilaterally but no significant
foraminal stenosis.

L5-S1: Annular fissure and small central disc protrusion but no
neural compression. Moderate epidural lipomatosis. No foraminal
stenosis.
IMPRESSION: 1. Focal central disc protrusion at L4-5 with mild mass effect on
the thecal sac. There is also epidural lipomatosis contributing to
mild spinal and bilateral lateral recess stenosis. Mild bilateral
foraminal encroachment but no significant foraminal stenosis.
2. Annular fissure and very small disc protrusion at L5-S1 but no
neural compression. Moderate epidural lipomatosis.

## 2017-04-22 IMAGING — CR DG ORBITS FOR FOREIGN BODY
2 series · 2 of 2 positions shown · non-contrast
Comparison: None.

CLINICAL DATA: Metal working/exposure; clearance prior to MRI

EXAM:
ORBITS FOR FOREIGN BODY - 2 VIEW

[orbits waters (1 of 2)]
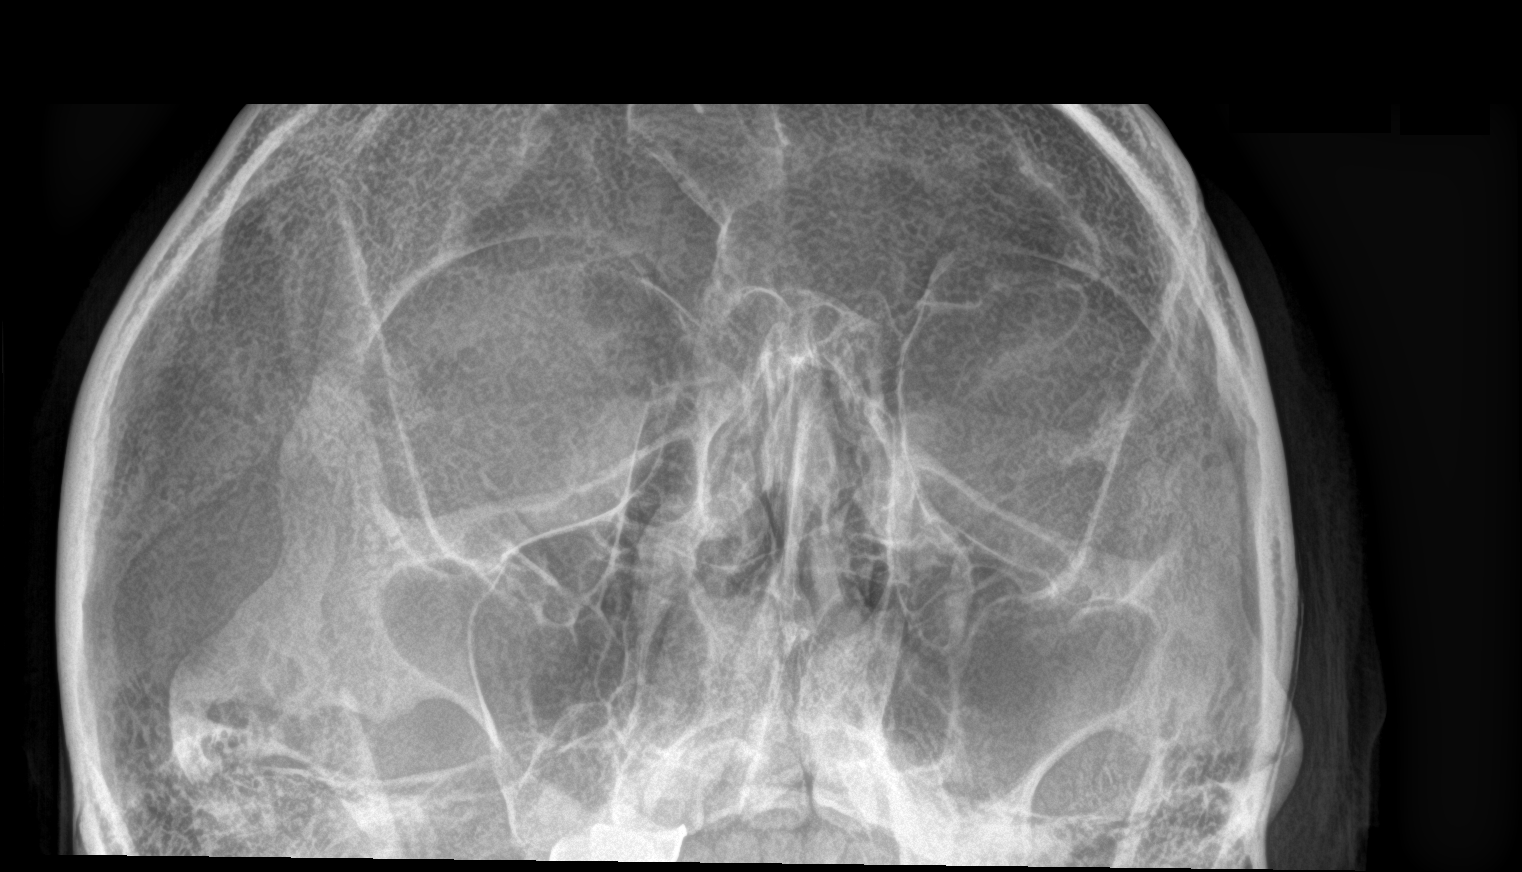

[orbits waters (2 of 2)]
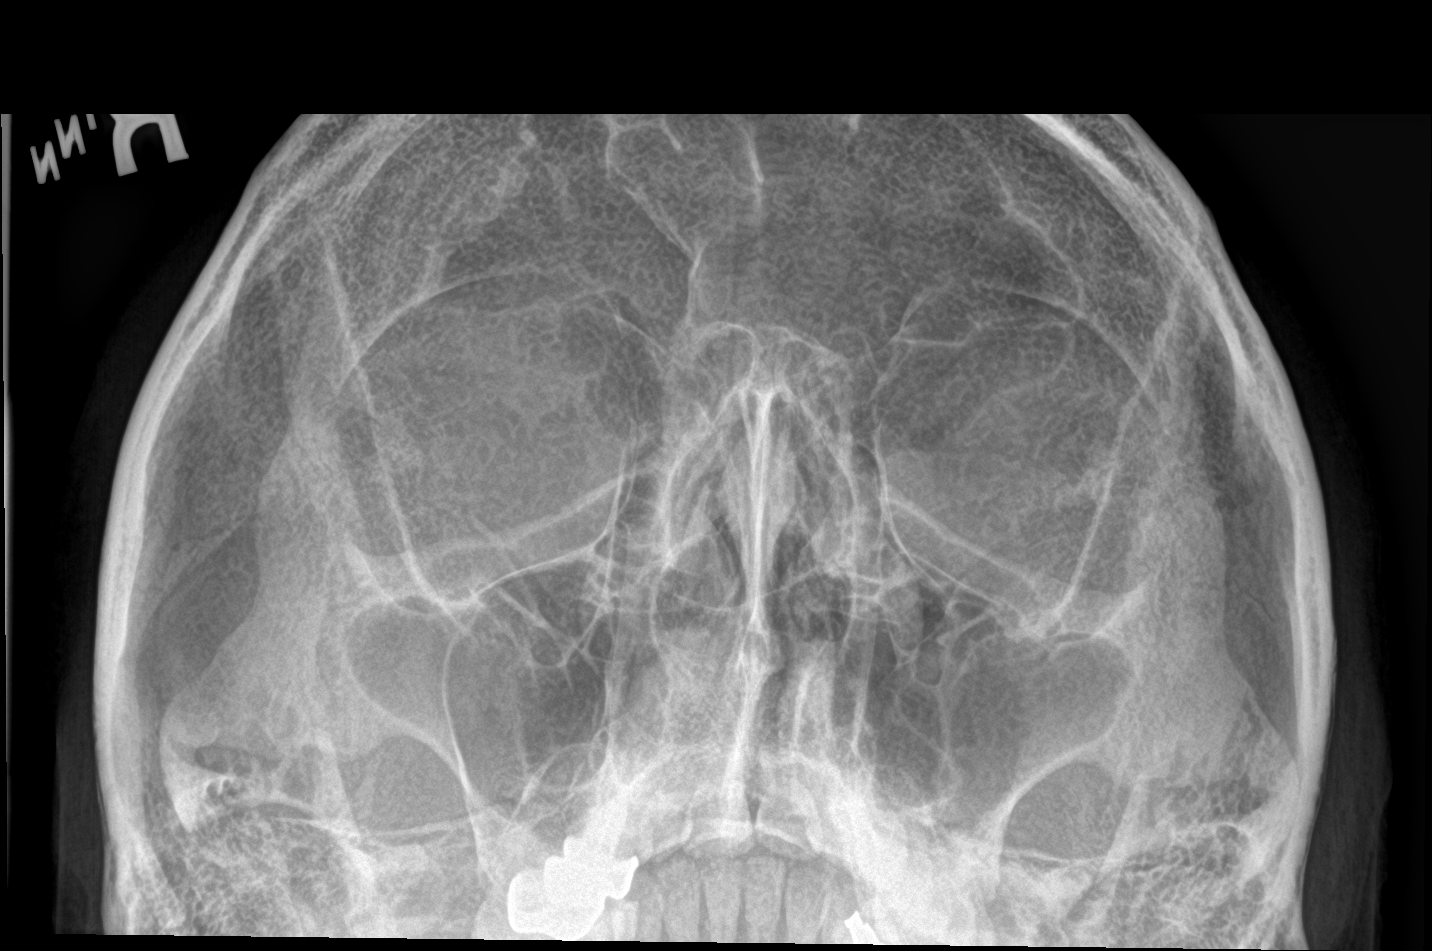

[2 of 2 positions shown; findings below may reference images not displayed]

FINDINGS: There is no evidence of metallic foreign body within the orbits. No
significant bone abnormality identified.
IMPRESSION: No evidence of metallic foreign body within the orbits.

## 2017-07-04 ENCOUNTER — Ambulatory Visit: Payer: 59 | Admitting: Internal Medicine

## 2017-07-04 ENCOUNTER — Encounter: Payer: Self-pay | Admitting: Internal Medicine

## 2017-07-04 VITALS — BP 132/100 | HR 75 | Temp 98.2°F | Resp 16 | Ht 70.0 in | Wt 237.2 lb

## 2017-07-04 DIAGNOSIS — G479 Sleep disorder, unspecified: Secondary | ICD-10-CM | POA: Diagnosis not present

## 2017-07-04 DIAGNOSIS — Z125 Encounter for screening for malignant neoplasm of prostate: Secondary | ICD-10-CM

## 2017-07-04 DIAGNOSIS — Z0001 Encounter for general adult medical examination with abnormal findings: Secondary | ICD-10-CM | POA: Diagnosis not present

## 2017-07-04 DIAGNOSIS — J3089 Other allergic rhinitis: Secondary | ICD-10-CM | POA: Diagnosis not present

## 2017-07-04 DIAGNOSIS — E785 Hyperlipidemia, unspecified: Secondary | ICD-10-CM | POA: Diagnosis not present

## 2017-07-04 DIAGNOSIS — D563 Thalassemia minor: Secondary | ICD-10-CM | POA: Diagnosis not present

## 2017-07-04 DIAGNOSIS — R030 Elevated blood-pressure reading, without diagnosis of hypertension: Secondary | ICD-10-CM | POA: Diagnosis not present

## 2017-07-04 NOTE — Progress Notes (Signed)
[facility] [address] [place], [place] [address]  Internal MEDICINE  Office Visit Note  Patient Name: Noah Cummings  [id number]  [id number]  Date of Service: 07/04/2017     Complaints/HPI Pt is here for routine follow up.  C/o cough and wheezing for few days. Denies any fever or chills, does have h/o OSA and will like it to be retested. BP is elevated today. He admits to take OTC cough meds with some relief     Current Medication: Outpatient Encounter Medications as of 07/04/2017  Medication Sig  . diazepam (VALIUM) 2 MG tablet Take 1 tablet (2 mg total) by mouth every 8 (eight) hours as needed for muscle spasms. (Patient not taking: Reported on 07/04/2017)  . HYDROcodone-acetaminophen (NORCO) 5-325 MG tablet Take 1 tablet by mouth every 6 (six) hours as needed for moderate pain. (Patient not taking: Reported on 07/04/2017)  . ibuprofen (ADVIL,MOTRIN) 800 MG tablet Take 1 tablet (800 mg total) by mouth every 8 (eight) hours as needed for moderate pain. (Patient not taking: Reported on 07/04/2017)  . lidocaine (LIDODERM) 5 % Place 1 patch onto the skin daily. Remove & Discard patch within 12 hours or as directed by MD (Patient not taking: Reported on 07/04/2017)   No facility-administered encounter medications on file as of 07/04/2017.     Surgical History: History reviewed. No pertinent surgical history.  Medical History: Past Medical History:  Diagnosis Date  . Hyperlipidemia   . Sleep disorder     Family History: Family History  Problem Relation Age of Onset  . Diabetes Mother   . Thyroid disease Mother   . Prostate cancer Maternal Uncle   . Diabetes Maternal Grandfather     Social History   Socioeconomic History  . Marital status: Married    Spouse name: Not on file  . Number of children: Not on file  . Years of education: Not on file  . Highest education level: Not on file  Social Needs  . Financial resource strain: Not on file  . Food insecurity -  worry: Not on file  . Food insecurity - inability: Not on file  . Transportation needs - medical: Not on file  . Transportation needs - non-medical: Not on file  Occupational History  . Not on file  Tobacco Use  . Smoking status: Never Smoker  . Smokeless tobacco: Never Used  Substance and Sexual Activity  . Alcohol use: No  . Drug use: No  . Sexual activity: Yes  Other Topics Concern  . Not on file  Social History Narrative  . Not on file      Review of Systems  Constitutional: Negative for chills, fatigue and unexpected weight change.  HENT: Positive for postnasal drip. Negative for congestion, rhinorrhea, sneezing and sore throat.   Eyes: Negative for redness.  Respiratory: Negative for cough, chest tightness and shortness of breath.   Cardiovascular: Negative for chest pain and palpitations.  Gastrointestinal: Negative for abdominal pain, constipation, diarrhea, nausea and vomiting.  Genitourinary: Negative for dysuria and frequency.  Musculoskeletal: Negative for arthralgias, back pain, joint swelling and neck pain.  Skin: Negative for rash.  Neurological: Negative.  Negative for tremors and numbness.  Hematological: Negative for adenopathy. Does not bruise/bleed easily.  Psychiatric/Behavioral: Negative for behavioral problems (Depression), sleep disturbance and suicidal ideas. The patient is not nervous/anxious.     Vital Signs: BP (!) 132/100 (BP Location: Left Arm, Patient Position: Sitting)   Pulse 75   Temp 98.2  F (36.8 C) (Oral)   Resp 16   Ht 5\' 10"  (1.778 m)   Wt 237 lb 3.2 oz (107.6 kg)   SpO2 96%   BMI 34.03 kg/m    Physical Exam  Constitutional: He is oriented to person, place, and time. He appears well-developed and well-nourished. No distress.  HENT:  Head: Normocephalic and atraumatic.  Mouth/Throat: Oropharynx is clear and moist. No oropharyngeal exudate.  Eyes: EOM are normal. Pupils are equal, round, and reactive to light.  Neck: Normal  range of motion. Neck supple. No JVD present. No tracheal deviation present. No thyromegaly present.  Cardiovascular: Normal rate, regular rhythm and normal heart sounds. Exam reveals no gallop and no friction rub.  No murmur heard. Pulmonary/Chest: Effort normal. No respiratory distress. He has no wheezes. He has no rales. He exhibits no tenderness.  Abdominal: Soft. Bowel sounds are normal.  Musculoskeletal: Normal range of motion.  Lymphadenopathy:    He has no cervical adenopathy.  Neurological: He is alert and oriented to person, place, and time. No cranial nerve deficit.  Skin: He is not diaphoretic.  Psychiatric: He has a normal mood and affect. His behavior is normal. Judgment and thought content normal.      Assessment/Plan: 1. Non-seasonal allergic rhinitis, unspecified trigger Improving at this time, otc Flonase   2. Hyperlipidemia, unspecified hyperlipidemia type  Lipid Panel With LDL/HDL Ratio  3. Elevated blood pressure reading Monitor for now  - Urinalysis - Home sleep test  4. Beta thalassemia trait Per hematology   5. Sleep disturbance  - Home sleep test  6. Screening PSA (prostate specific antigen)  - PSA, total and free - PSA, total and free  7. Encounter for general adult medical examination with abnormal findings  - CBC with Differential/Platelet - TSH - T4, free - Comprehensive metabolic panel Future labs for CPE ( NOT PERFORMED TODAY)     Counseling: obstructive sleep apnea      Time spent:30    Dr Lyndon CodeFozia M Mystery Schrupp Internal medicine

## 2017-07-05 LAB — COMPREHENSIVE METABOLIC PANEL
ALK PHOS: 49 IU/L (ref 39–117)
ALT: 45 IU/L — ABNORMAL HIGH (ref 0–44)
AST: 18 IU/L (ref 0–40)
Albumin/Globulin Ratio: 2 (ref 1.2–2.2)
Albumin: 4.4 g/dL (ref 3.5–5.5)
BILIRUBIN TOTAL: 1.1 mg/dL (ref 0.0–1.2)
BUN/Creatinine Ratio: 12 (ref 9–20)
BUN: 14 mg/dL (ref 6–24)
CO2: 23 mmol/L (ref 20–29)
Calcium: 8.9 mg/dL (ref 8.7–10.2)
Chloride: 103 mmol/L (ref 96–106)
Creatinine, Ser: 1.18 mg/dL (ref 0.76–1.27)
GFR calc Af Amer: 85 mL/min/{1.73_m2} (ref >59)
GFR calc non Af Amer: 74 mL/min/{1.73_m2} (ref >59)
GLOBULIN, TOTAL: 2.2 g/dL (ref 1.5–4.5)
Glucose: 93 mg/dL (ref 65–99)
Potassium: 4.3 mmol/L (ref 3.5–5.2)
SODIUM: 140 mmol/L (ref 134–144)
Total Protein: 6.6 g/dL (ref 6.0–8.5)

## 2017-07-05 LAB — LIPID PANEL WITH LDL/HDL RATIO
Cholesterol, Total: 202 mg/dL — ABNORMAL HIGH (ref 100–199)
HDL: 36 mg/dL — AB (ref >39)
LDL Calculated: 153 mg/dL — ABNORMAL HIGH (ref 0–99)
LDL/HDL RATIO: 4.3 ratio — AB (ref 0.0–3.6)
Triglycerides: 67 mg/dL (ref 0–149)
VLDL CHOLESTEROL CAL: 13 mg/dL (ref 5–40)

## 2017-07-05 LAB — CBC WITH DIFFERENTIAL/PLATELET
BASOS: 1 %
Basophils Absolute: 0.1 10*3/uL (ref 0.0–0.2)
EOS (ABSOLUTE): 0.1 10*3/uL (ref 0.0–0.4)
EOS: 2 %
HEMATOCRIT: 40.9 % (ref 37.5–51.0)
Hemoglobin: 13.6 g/dL (ref 13.0–17.7)
IMMATURE GRANULOCYTES: 0 %
Immature Grans (Abs): 0 10*3/uL (ref 0.0–0.1)
Lymphocytes Absolute: 2.3 10*3/uL (ref 0.7–3.1)
Lymphs: 37 %
MCH: 22.7 pg — ABNORMAL LOW (ref 26.6–33.0)
MCHC: 33.3 g/dL (ref 31.5–35.7)
MCV: 68 fL — AB (ref 79–97)
MONOS ABS: 0.5 10*3/uL (ref 0.1–0.9)
Monocytes: 9 %
NEUTROS PCT: 51 %
Neutrophils Absolute: 3.2 10*3/uL (ref 1.4–7.0)
Platelets: 266 10*3/uL (ref 150–379)
RBC: 6 x10E6/uL — ABNORMAL HIGH (ref 4.14–5.80)
RDW: 17 % — AB (ref 12.3–15.4)
WBC: 6.1 10*3/uL (ref 3.4–10.8)

## 2017-07-05 LAB — PSA, TOTAL AND FREE
PROSTATE SPECIFIC AG, SERUM: 1.8 ng/mL (ref 0.0–4.0)
PSA FREE: 0.5 ng/mL
PSA, Free Pct: 27.8 %

## 2017-07-05 LAB — TSH: TSH: 1.69 u[IU]/mL (ref 0.450–4.500)

## 2017-07-05 LAB — T4, FREE: Free T4: 1.23 ng/dL (ref 0.82–1.77)

## 2017-08-04 ENCOUNTER — Encounter: Payer: Self-pay | Admitting: Nurse Practitioner

## 2018-04-07 ENCOUNTER — Ambulatory Visit (INDEPENDENT_AMBULATORY_CARE_PROVIDER_SITE_OTHER): Payer: 59 | Admitting: Adult Health

## 2018-04-07 ENCOUNTER — Encounter: Payer: Self-pay | Admitting: Adult Health

## 2018-04-07 VITALS — BP 128/89 | HR 92 | Resp 16 | Ht 70.0 in | Wt 232.0 lb

## 2018-04-07 DIAGNOSIS — R300 Dysuria: Secondary | ICD-10-CM | POA: Diagnosis not present

## 2018-04-07 DIAGNOSIS — Z0001 Encounter for general adult medical examination with abnormal findings: Secondary | ICD-10-CM

## 2018-04-07 DIAGNOSIS — E785 Hyperlipidemia, unspecified: Secondary | ICD-10-CM | POA: Diagnosis not present

## 2018-04-07 DIAGNOSIS — Z125 Encounter for screening for malignant neoplasm of prostate: Secondary | ICD-10-CM | POA: Diagnosis not present

## 2018-04-07 NOTE — Patient Instructions (Signed)

## 2018-04-07 NOTE — Progress Notes (Signed)
[facility] [address] [place], [place] [address]  Internal MEDICINE  Office Visit Note  Patient Name: Noah Cummings  [id number]  [id number]  Date of Service: 04/07/2018  Chief Complaint  Patient presents with  . Annual Exam  . Hyperlipidemia     HPI Pt is here for routine health maintenance examination. He is a 47 yo AA male. He is generally healthy.  He reports he has gained some weight and is taking steps to work on that.  He does not use alcohol, tobacco or street drugs.  He would like to consider Phentermine for weight loss to help him. He is also reports a cough and eye watering recently.   Current Medication: Outpatient Encounter Medications as of 04/07/2018  Medication Sig  . [DISCONTINUED] diazepam (VALIUM) 2 MG tablet Take 1 tablet (2 mg total) by mouth every 8 (eight) hours as needed for muscle spasms. (Patient not taking: Reported on 07/04/2017)  . [DISCONTINUED] HYDROcodone-acetaminophen (NORCO) 5-325 MG tablet Take 1 tablet by mouth every 6 (six) hours as needed for moderate pain. (Patient not taking: Reported on 07/04/2017)  . [DISCONTINUED] ibuprofen (ADVIL,MOTRIN) 800 MG tablet Take 1 tablet (800 mg total) by mouth every 8 (eight) hours as needed for moderate pain. (Patient not taking: Reported on 07/04/2017)  . [DISCONTINUED] lidocaine (LIDODERM) 5 % Place 1 patch onto the skin daily. Remove & Discard patch within 12 hours or as directed by MD (Patient not taking: Reported on 07/04/2017)   No facility-administered encounter medications on file as of 04/07/2018.     Surgical History: History reviewed. No pertinent surgical history.  Medical History: Past Medical History:  Diagnosis Date  . Hyperlipidemia   . Sleep disorder     Family History: Family History  Problem Relation Age of Onset  . Diabetes Mother   . Thyroid disease Mother   . Prostate cancer Maternal Uncle   . Diabetes Maternal Grandfather       Review of Systems  Constitutional:  Negative.  Negative for chills, fatigue and unexpected weight change.  HENT: Positive for rhinorrhea. Negative for congestion, sneezing and sore throat.   Eyes: Negative for redness.       Watery eyes  Respiratory: Positive for cough. Negative for chest tightness and shortness of breath.   Cardiovascular: Negative.  Negative for chest pain and palpitations.  Gastrointestinal: Negative.  Negative for abdominal pain, constipation, diarrhea, nausea and vomiting.  Endocrine: Negative.   Genitourinary: Negative.  Negative for dysuria and frequency.  Musculoskeletal: Negative.  Negative for arthralgias, back pain, joint swelling and neck pain.  Skin: Negative.  Negative for rash.  Allergic/Immunologic: Negative.   Neurological: Negative.  Negative for tremors and numbness.  Hematological: Negative for adenopathy. Does not bruise/bleed easily.  Psychiatric/Behavioral: Negative.  Negative for behavioral problems, sleep disturbance and suicidal ideas. The patient is not nervous/anxious.      Vital Signs: BP 128/89   Pulse 92   Resp 16   Ht 5\' 10"  (1.778 m)   Wt 232 lb (105.2 kg)   SpO2 96%   BMI 33.29 kg/m    Physical Exam  Constitutional: He is oriented to person, place, and time. He appears well-developed and well-nourished. No distress.  HENT:  Head: Normocephalic and atraumatic.  Mouth/Throat: Oropharynx is clear and moist. No oropharyngeal exudate.  Eyes: Pupils are equal, round, and reactive to light. EOM are normal.  Neck: Normal range of motion. Neck supple. No JVD present. No tracheal deviation present. No thyromegaly present.  Cardiovascular: Normal rate, regular rhythm and normal heart sounds. Exam reveals no gallop and no friction rub.  No murmur heard. Pulmonary/Chest: Effort normal and breath sounds normal. No respiratory distress. He has no wheezes. He has no rales. He exhibits no mass and no tenderness.  Abdominal: Soft. There is no tenderness. There is no guarding.   Musculoskeletal: Normal range of motion.  Lymphadenopathy:    He has no cervical adenopathy.  Neurological: He is alert and oriented to person, place, and time. No cranial nerve deficit.  Skin: Skin is warm and dry. He is not diaphoretic.  Psychiatric: He has a normal mood and affect. His behavior is normal. Judgment and thought content normal.  Nursing note and vitals reviewed.    LABS: No results found for this or any previous visit (from the past 2160 hour(s)).   Assessment/Plan: 1. Encounter for general adult medical examination with abnormal findings Up to date on PHM. - CBC with Differential/Platelet - Lipid Panel With LDL/HDL Ratio - TSH - T4, free - Comprehensive metabolic panel  2. Hyperlipidemia, unspecified hyperlipidemia type Assess lipid panel and treat accordingly.   3. Screening for prostate cancer - PSA  4. Dysuria - UA/M w/rflx Culture, Routine  General Counseling: Elic verbalizes understanding of the findings of todays visit and agrees with plan of treatment. I have discussed any further diagnostic evaluation that may be needed or ordered today. We also reviewed his medications today. he has been encouraged to call the office with any questions or concerns that should arise related to todays visit.   Orders Placed This Encounter  Procedures  . UA/M w/rflx Culture, Routine  . CBC with Differential/Platelet  . Lipid Panel With LDL/HDL Ratio  . TSH  . T4, free  . Comprehensive metabolic panel  . PSA    No orders of the defined types were placed in this encounter.   Time spent: 30 Minutes   This patient was seen by Blima Ledger AGNP-C in Collaboration with Dr Lyndon Code as a part of collaborative care agreement   Blima Ledger [facility]  Internal Medicine

## 2018-04-08 ENCOUNTER — Other Ambulatory Visit: Payer: Self-pay | Admitting: Adult Health

## 2018-04-08 ENCOUNTER — Other Ambulatory Visit: Payer: Self-pay

## 2018-04-08 DIAGNOSIS — E1165 Type 2 diabetes mellitus with hyperglycemia: Secondary | ICD-10-CM

## 2018-04-08 NOTE — Progress Notes (Signed)
he

## 2018-04-09 LAB — MICROSCOPIC EXAMINATION: Casts: NONE SEEN /lpf

## 2018-04-09 LAB — UA/M W/RFLX CULTURE, ROUTINE
Bilirubin, UA: NEGATIVE
GLUCOSE, UA: NEGATIVE
Leukocytes, UA: NEGATIVE
NITRITE UA: NEGATIVE
RBC, UA: NEGATIVE
Urobilinogen, Ur: 1 mg/dL (ref 0.2–1.0)
pH, UA: 5 (ref 5.0–7.5)

## 2018-04-09 LAB — URINE CULTURE, REFLEX: Organism ID, Bacteria: NO GROWTH

## 2018-04-24 ENCOUNTER — Ambulatory Visit: Payer: Self-pay | Admitting: Adult Health

## 2018-09-04 ENCOUNTER — Encounter: Payer: Self-pay | Admitting: Adult Health

## 2018-09-04 ENCOUNTER — Ambulatory Visit: Payer: 59 | Admitting: Adult Health

## 2018-09-04 ENCOUNTER — Other Ambulatory Visit: Payer: Self-pay

## 2018-09-04 VITALS — BP 132/98 | HR 73 | Temp 98.8°F | Resp 16 | Ht 70.0 in | Wt 239.0 lb

## 2018-09-04 DIAGNOSIS — J0110 Acute frontal sinusitis, unspecified: Secondary | ICD-10-CM

## 2018-09-04 DIAGNOSIS — J40 Bronchitis, not specified as acute or chronic: Secondary | ICD-10-CM | POA: Diagnosis not present

## 2018-09-04 MED ORDER — CEFDINIR 300 MG PO CAPS: 300.0000 mg | ORAL_CAPSULE | Freq: Two times a day (BID) | ORAL | 0 refills | Status: DC

## 2018-09-04 NOTE — Patient Instructions (Signed)

## 2018-09-04 NOTE — Progress Notes (Signed)
[facility] [address] [place], [place] [address]  Internal MEDICINE  Office Visit Note  Patient Name: Noah Cummings  [id number]  [id number]  Date of Service: 09/04/2018  Chief Complaint  Patient presents with  . Laryngitis    started yesterday , wheezing a little bit , been using mucinex   . Sore Throat     HPI Pt is here for a sick visit. Pt reports cough that started a week ago.  He denies fever or chills.  He reports it converted into congestion and green mucous.  He has been taking oTC medications with mild relief.  Yesterday he noticed some laryngitis and difficulty speaking.    Current Medication:  Outpatient Encounter Medications as of 09/04/2018  Medication Sig  . cefdinir (OMNICEF) 300 MG capsule Take 1 capsule (300 mg total) by mouth 2 (two) times daily.   No facility-administered encounter medications on file as of 09/04/2018.       Medical History: Past Medical History:  Diagnosis Date  . Hyperlipidemia   . Sleep disorder      Vital Signs: BP (!) 132/98   Pulse 73   Temp 98.8 F (37.1 C) (Oral)   Resp 16   Ht 5\' 10"  (1.778 m)   Wt 239 lb (108.4 kg)   SpO2 96%   BMI 34.29 kg/m    Review of Systems  Constitutional: Negative.  Negative for chills, fatigue and unexpected weight change.  HENT: Negative.  Negative for congestion, rhinorrhea, sneezing and sore throat.   Eyes: Negative for redness.  Respiratory: Negative.  Negative for cough, chest tightness and shortness of breath.   Cardiovascular: Negative.  Negative for chest pain and palpitations.  Gastrointestinal: Negative.  Negative for abdominal pain, constipation, diarrhea, nausea and vomiting.  Endocrine: Negative.   Genitourinary: Negative.  Negative for dysuria and frequency.  Musculoskeletal: Negative.  Negative for arthralgias, back pain, joint swelling and neck pain.  Skin: Negative.  Negative for rash.  Allergic/Immunologic: Negative.   Neurological: Negative.   Negative for tremors and numbness.  Hematological: Negative for adenopathy. Does not bruise/bleed easily.  Psychiatric/Behavioral: Negative.  Negative for behavioral problems, sleep disturbance and suicidal ideas. The patient is not nervous/anxious.     Physical Exam Vitals signs and nursing note reviewed.  Constitutional:      General: He is not in acute distress.    Appearance: He is well-developed. He is not diaphoretic.  HENT:     Head: Normocephalic and atraumatic.     Mouth/Throat:     Pharynx: No oropharyngeal exudate.  Eyes:     Pupils: Pupils are equal, round, and reactive to light.  Neck:     Musculoskeletal: Normal range of motion and neck supple.     Thyroid: No thyromegaly.     Vascular: No JVD.     Trachea: No tracheal deviation.  Cardiovascular:     Rate and Rhythm: Normal rate and regular rhythm.     Heart sounds: Normal heart sounds. No murmur. No friction rub. No gallop.   Pulmonary:     Effort: Pulmonary effort is normal. No respiratory distress.     Breath sounds: Normal breath sounds. No wheezing or rales.  Chest:     Chest wall: No tenderness.  Abdominal:     Palpations: Abdomen is soft.     Tenderness: There is no abdominal tenderness. There is no guarding.  Musculoskeletal: Normal range of motion.  Lymphadenopathy:     Cervical: No cervical adenopathy.  Skin:    General: Skin is warm and dry.  Neurological:     Mental Status: He is alert and oriented to person, place, and time.     Cranial Nerves: No cranial nerve deficit.  Psychiatric:        Behavior: Behavior normal.        Thought Content: Thought content normal.        Judgment: Judgment normal.    Assessment/Plan: 1. Acute non-recurrent frontal sinusitis Provided patient with course of Omnicef.  Instructed patient to take medication as prescribed until completion.  Take with food.  Instructed patient to return to clinic 11 to 10 days if symptoms fail to improve. - cefdinir (OMNICEF) 300  MG capsule; Take 1 capsule (300 mg total) by mouth 2 (two) times daily.  Dispense: 20 capsule; Refill: 0  2. Bronchitis Stable, patient prescribed cefdinir.  General Counseling: donzel clavette understanding of the findings of todays visit and agrees with plan of treatment. I have discussed any further diagnostic evaluation that may be needed or ordered today. We also reviewed his medications today. he has been encouraged to call the office with any questions or concerns that should arise related to todays visit.   No orders of the defined types were placed in this encounter.   Meds ordered this encounter  Medications  . cefdinir (OMNICEF) 300 MG capsule    Sig: Take 1 capsule (300 mg total) by mouth 2 (two) times daily.    Dispense:  20 capsule    Refill:  0    Time spent: 25 Minutes  This patient was seen by Blima Ledger AGNP-C in Collaboration with Dr Lyndon Code as a part of collaborative care agreement.  Johnna Acosta AGNP-C Internal Medicine

## 2018-09-16 ENCOUNTER — Ambulatory Visit: Payer: 59 | Admitting: Adult Health

## 2018-09-16 ENCOUNTER — Encounter: Payer: Self-pay | Admitting: Adult Health

## 2018-09-16 VITALS — BP 130/90 | HR 69 | Temp 99.0°F | Resp 16 | Ht 70.0 in | Wt 236.0 lb

## 2018-09-16 DIAGNOSIS — E6609 Other obesity due to excess calories: Secondary | ICD-10-CM

## 2018-09-16 DIAGNOSIS — Z6833 Body mass index (BMI) 33.0-33.9, adult: Secondary | ICD-10-CM

## 2018-09-16 DIAGNOSIS — R05 Cough: Secondary | ICD-10-CM | POA: Diagnosis not present

## 2018-09-16 DIAGNOSIS — R059 Cough, unspecified: Secondary | ICD-10-CM

## 2018-09-16 DIAGNOSIS — J40 Bronchitis, not specified as acute or chronic: Secondary | ICD-10-CM

## 2018-09-16 MED ORDER — BENZONATATE 100 MG PO CAPS: 100.0000 mg | ORAL_CAPSULE | Freq: Two times a day (BID) | ORAL | 0 refills | Status: DC | PRN

## 2018-09-16 NOTE — Patient Instructions (Signed)

## 2018-09-16 NOTE — Progress Notes (Signed)
[facility] [address] [place], [place] [address]  Internal MEDICINE  Office Visit Note  Patient Name: Noah Cummings  [id number]  [id number]  Date of Service: 09/16/2018  Chief Complaint  Patient presents with  . Cough    nagging cough for 3 weeks now      HPI Pt is here for a sick visit. He was treated for resp infection a few weeks ago. He has taken a course of Cefdinir.  Pt reports he has continued cough.  His other symptoms have resolved.  He denies any fever or sore throat. He states he is feeling better and he really only coughs during the day.       Current Medication:  Outpatient Encounter Medications as of 09/16/2018  Medication Sig  . cefdinir (OMNICEF) 300 MG capsule Take 1 capsule (300 mg total) by mouth 2 (two) times daily.  . benzonatate (TESSALON) 100 MG capsule Take 1-2 capsules (100-200 mg total) by mouth 2 (two) times daily as needed for cough.   No facility-administered encounter medications on file as of 09/16/2018.       Medical History: Past Medical History:  Diagnosis Date  . Hyperlipidemia   . Sleep disorder      Vital Signs: BP 130/90   Pulse 69   Temp 99 F (37.2 C)   Resp 16   Ht 5\' 10"  (1.778 m)   Wt 236 lb (107 kg)   SpO2 97%   BMI 33.86 kg/m    Review of Systems  Constitutional: Negative.  Negative for chills, fatigue and unexpected weight change.  HENT: Negative.  Negative for congestion, rhinorrhea, sneezing and sore throat.   Eyes: Negative for redness.  Respiratory: Negative.  Negative for cough, chest tightness and shortness of breath.   Cardiovascular: Negative.  Negative for chest pain and palpitations.  Gastrointestinal: Negative.  Negative for abdominal pain, constipation, diarrhea, nausea and vomiting.  Endocrine: Negative.   Genitourinary: Negative.  Negative for dysuria and frequency.  Musculoskeletal: Negative.  Negative for arthralgias, back pain, joint swelling and neck pain.  Skin: Negative.   Negative for rash.  Allergic/Immunologic: Negative.   Neurological: Negative.  Negative for tremors and numbness.  Hematological: Negative for adenopathy. Does not bruise/bleed easily.  Psychiatric/Behavioral: Negative.  Negative for behavioral problems, sleep disturbance and suicidal ideas. The patient is not nervous/anxious.     Physical Exam Vitals signs and nursing note reviewed.  Constitutional:      General: He is not in acute distress.    Appearance: He is well-developed. He is not diaphoretic.  HENT:     Head: Normocephalic and atraumatic.     Mouth/Throat:     Pharynx: No oropharyngeal exudate.  Eyes:     Pupils: Pupils are equal, round, and reactive to light.  Neck:     Musculoskeletal: Normal range of motion and neck supple.     Thyroid: No thyromegaly.     Vascular: No JVD.     Trachea: No tracheal deviation.  Cardiovascular:     Rate and Rhythm: Normal rate and regular rhythm.     Heart sounds: Normal heart sounds. No murmur. No friction rub. No gallop.   Pulmonary:     Effort: Pulmonary effort is normal. No respiratory distress.     Breath sounds: Normal breath sounds. No wheezing or rales.  Chest:     Chest wall: No tenderness.  Abdominal:     Palpations: Abdomen is soft.     Tenderness: There is  no abdominal tenderness. There is no guarding.  Musculoskeletal: Normal range of motion.  Lymphadenopathy:     Cervical: No cervical adenopathy.  Skin:    General: Skin is warm and dry.  Neurological:     Mental Status: He is alert and oriented to person, place, and time.     Cranial Nerves: No cranial nerve deficit.  Psychiatric:        Behavior: Behavior normal.        Thought Content: Thought content normal.        Judgment: Judgment normal.    Assessment/Plan: 1. Bronchitis Residual bronchitis from infection.  He feels much better and does not have any new symptoms.  Encouraged him to return to clinic as needed.   2. Cough Pt given tessalon pearls for  daytime cough.  Instructed patient to return to clinic if his cough becomes productive, he has fever or any new symptoms.  - benzonatate (TESSALON) 100 MG capsule; Take 1-2 capsules (100-200 mg total) by mouth 2 (two) times daily as needed for cough.  Dispense: 30 capsule; Refill: 0  3. Class 1 obesity due to excess calories with serious comorbidity and body mass index (BMI) of 33.0 to 33.9 in adult Obesity Counseling: Risk Assessment: An assessment of behavioral risk factors was made today and includes lack of exercise sedentary lifestyle, lack of portion control and poor dietary habits.  Risk Modification Advice: She was counseled on portion control guidelines. Restricting daily caloric intake to. . The detrimental long term effects of obesity on her health and ongoing poor compliance was also discussed with the patient.    General Counseling: ramadan chapmon understanding of the findings of todays visit and agrees with plan of treatment. I have discussed any further diagnostic evaluation that may be needed or ordered today. We also reviewed his medications today. he has been encouraged to call the office with any questions or concerns that should arise related to todays visit.   No orders of the defined types were placed in this encounter.   Meds ordered this encounter  Medications  . benzonatate (TESSALON) 100 MG capsule    Sig: Take 1-2 capsules (100-200 mg total) by mouth 2 (two) times daily as needed for cough.    Dispense:  30 capsule    Refill:  0    Time spent: 25 Minutes  This patient was seen by Blima Ledger AGNP-C in Collaboration with Dr Lyndon Code as a part of collaborative care agreement.  Johnna Acosta AGNP-C Internal Medicine

## 2018-10-14 ENCOUNTER — Ambulatory Visit: Payer: 59 | Admitting: Nurse Practitioner

## 2018-10-14 ENCOUNTER — Encounter: Payer: Self-pay | Admitting: Nurse Practitioner

## 2018-10-14 ENCOUNTER — Other Ambulatory Visit: Payer: Self-pay

## 2018-10-14 DIAGNOSIS — J0110 Acute frontal sinusitis, unspecified: Secondary | ICD-10-CM

## 2018-10-14 DIAGNOSIS — R05 Cough: Secondary | ICD-10-CM | POA: Diagnosis not present

## 2018-10-14 DIAGNOSIS — R059 Cough, unspecified: Secondary | ICD-10-CM

## 2018-10-14 MED ORDER — HYDROCOD POLST-CPM POLST ER 10-8 MG/5ML PO SUER: 5.0000 mL | Freq: Two times a day (BID) | ORAL | 0 refills | Status: DC | PRN

## 2018-10-14 MED ORDER — AZITHROMYCIN 250 MG PO TABS: ORAL_TABLET | ORAL | 0 refills | Status: DC

## 2018-10-14 NOTE — Progress Notes (Signed)
[facility] [address] [place], [place] [address]  Internal MEDICINE  Telephone Visit  Patient Name: Noah Cummings  [id number]  [id number]  Date of Service: 11/09/2018  I connected with the patient at 12:29pm by webcam and verified the patients identity using two identifiers.   I discussed the limitations, risks, security and privacy concerns of performing an evaluation and management service by webcam and the availability of in person appointments. I also discussed with the patient that there may be a patient responsible charge related to the service.  The patient expressed understanding and agrees to proceed.    Chief Complaint  Patient presents with  . Telephone Assessment  . Telephone Screen  . Cough  . Sinusitis    The patient has been contacted via webcam for follow up visit due to concerns for spread of novel coronavirus.   Sinusitis  This is a new problem. The current episode started in the past 7 days. The problem has been gradually worsening since onset. There has been no fever. The pain is mild. Associated symptoms include congestion, coughing, ear pain, headaches, sinus pressure, a sore throat and swollen glands. Pertinent negatives include no chills or shortness of breath. Past treatments include acetaminophen and oral decongestants. The treatment provided mild relief.       Current Medication: Outpatient Encounter Medications as of 10/14/2018  Medication Sig  . azithromycin (ZITHROMAX) 250 MG tablet z-pack - take as directed for 5 days  . benzonatate (TESSALON) 100 MG capsule Take 1-2 capsules (100-200 mg total) by mouth 2 (two) times daily as needed for cough. (Patient not taking: Reported on 10/14/2018)  . cefdinir (OMNICEF) 300 MG capsule Take 1 capsule (300 mg total) by mouth 2 (two) times daily. (Patient not taking: Reported on 10/14/2018)  . chlorpheniramine-HYDROcodone (TUSSIONEX PENNKINETIC ER) 10-8 MG/5ML SUER Take 5 mLs by mouth every 12  (twelve) hours as needed for cough.   No facility-administered encounter medications on file as of 10/14/2018.     Surgical History: History reviewed. No pertinent surgical history.  Medical History: Past Medical History:  Diagnosis Date  . Hyperlipidemia   . Sleep disorder     Family History: Family History  Problem Relation Age of Onset  . Diabetes Mother   . Thyroid disease Mother   . Prostate cancer Maternal Uncle   . Diabetes Maternal Grandfather     Social History   Socioeconomic History  . Marital status: Married    Spouse name: Not on file  . Number of children: Not on file  . Years of education: Not on file  . Highest education level: Not on file  Occupational History  . Not on file  Social Needs  . Financial resource strain: Not on file  . Food insecurity:    Worry: Not on file    Inability: Not on file  . Transportation needs:    Medical: Not on file    Non-medical: Not on file  Tobacco Use  . Smoking status: Never Smoker  . Smokeless tobacco: Never Used  Substance and Sexual Activity  . Alcohol use: No  . Drug use: No  . Sexual activity: Yes  Lifestyle  . Physical activity:    Days per week: Not on file    Minutes per session: Not on file  . Stress: Not on file  Relationships  . Social connections:    Talks on phone: Not on file    Gets together: Not on file    Attends  religious service: Not on file    Active member of club or organization: Not on file    Attends meetings of clubs or organizations: Not on file    Relationship status: Not on file  . Intimate partner violence:    Fear of current or ex partner: Not on file    Emotionally abused: Not on file    Physically abused: Not on file    Forced sexual activity: Not on file  Other Topics Concern  . Not on file  Social History Narrative  . Not on file      Review of Systems  Constitutional: Positive for fatigue. Negative for appetite change, chills and fever.  HENT: Positive for  congestion, ear pain, postnasal drip, sinus pressure, sinus pain and sore throat.   Respiratory: Positive for cough. Negative for shortness of breath and wheezing.   Cardiovascular: Negative for chest pain and palpitations.  Gastrointestinal: Negative for nausea and vomiting.  Musculoskeletal: Positive for arthralgias and myalgias.  Allergic/Immunologic: Positive for environmental allergies.  Neurological: Positive for headaches.  Hematological: Positive for adenopathy.    Vital Signs: There were no vitals taken for this visit.   Observation/Objective:  The patient is alert and oriented. He is nasally congested. He appears ill. Breathing is non-labored, owever, he does have congested, non-productive cough which is observed over the webcam.    Assessment/Plan: 1. Acute non-recurrent frontal sinusitis zz-pack - take as directed for 5 days. Rest and increase fluids. Rest and incrase fluids. Should take OTC medicatoins as needed and as indicated for acute symptoms . - azithromycin (ZITHROMAX) 250 MG tablet; z-pack - take as directed for 5 days  Dispense: 6 tablet; Refill: 0  2. Cough May take tussionex twice daily as needed for cough. Advised patient not to overuse this medicine and not to mix with other medications or alcohol as it can cause respiratory distress, sleepiness or dizziness. Should also avoid driving. Patient voiced understanding and agreement.  - chlorpheniramine-HYDROcodone (TUSSIONEX PENNKINETIC ER) 10-8 MG/5ML SUER; Take 5 mLs by mouth every 12 (twelve) hours as needed for cough.  Dispense: 115 mL; Refill: 0  General Counseling: Noah Cummings verbalizes understanding of the findings of today's phone visit and agrees with plan of treatment. I have discussed any further diagnostic evaluation that may be needed or ordered today. We also reviewed his medications today. he has been encouraged to call the office with any questions or concerns that should arise related to todays  visit.  Rest and increase fluids. Continue using OTC medication to control symptoms.   This patient was seen by Vincent GrosHeather Cj Beecher FNP Collaboration with Dr Lyndon CodeFozia M Khan as a part of collaborative care agreement  Meds ordered this encounter  Medications  . azithromycin (ZITHROMAX) 250 MG tablet    Sig: z-pack - take as directed for 5 days    Dispense:  6 tablet    Refill:  0    Order Specific Question:   Supervising Provider    Answer:   Lyndon CodeKHAN, FOZIA M [[id number]]  . chlorpheniramine-HYDROcodone (TUSSIONEX PENNKINETIC ER) 10-8 MG/5ML SUER    Sig: Take 5 mLs by mouth every 12 (twelve) hours as needed for cough.    Dispense:  115 mL    Refill:  0    Order Specific Question:   Supervising Provider    Answer:   Lyndon CodeKHAN, FOZIA M [[id number]]    Time spent: 3915 Minutes    Dr Lyndon CodeFozia M Khan Internal medicine

## 2018-11-09 DIAGNOSIS — R05 Cough: Secondary | ICD-10-CM | POA: Insufficient documentation

## 2018-11-09 DIAGNOSIS — J0110 Acute frontal sinusitis, unspecified: Secondary | ICD-10-CM | POA: Insufficient documentation

## 2018-11-09 DIAGNOSIS — R059 Cough, unspecified: Secondary | ICD-10-CM | POA: Insufficient documentation

## 2019-12-01 ENCOUNTER — Telehealth: Payer: Self-pay

## 2019-12-01 NOTE — Telephone Encounter (Signed)
Lmom to confirm and screen for 12-03-19 ov. 

## 2019-12-03 ENCOUNTER — Encounter: Payer: 59 | Admitting: Nurse Practitioner

## 2020-02-21 ENCOUNTER — Encounter: Payer: Self-pay | Admitting: Adult Health

## 2020-02-21 ENCOUNTER — Other Ambulatory Visit: Payer: Self-pay

## 2020-02-21 ENCOUNTER — Ambulatory Visit: Payer: 59 | Admitting: Adult Health

## 2020-02-21 VITALS — BP 120/90 | HR 76 | Temp 97.3°F | Resp 16 | Ht 70.0 in | Wt 236.8 lb

## 2020-02-21 DIAGNOSIS — E785 Hyperlipidemia, unspecified: Secondary | ICD-10-CM

## 2020-02-21 DIAGNOSIS — R7309 Other abnormal glucose: Secondary | ICD-10-CM | POA: Diagnosis not present

## 2020-02-21 DIAGNOSIS — Z3009 Encounter for other general counseling and advice on contraception: Secondary | ICD-10-CM | POA: Diagnosis not present

## 2020-02-21 DIAGNOSIS — Z0001 Encounter for general adult medical examination with abnormal findings: Secondary | ICD-10-CM

## 2020-02-21 LAB — POCT GLYCOSYLATED HEMOGLOBIN (HGB A1C): Hemoglobin A1C: 5.5 % (ref 4.0–5.6)

## 2020-02-21 NOTE — Progress Notes (Signed)
[facility] [address] [place], [place] [address]  Internal MEDICINE  Office Visit Note  Patient Name: Noah Cummings  [id number]  [id number]  Date of Service: 02/21/2020  Chief Complaint  Patient presents with  . referal    discuss referal for vasectomy  . Quality Metric Gaps    Hep C screen, Tdap, HIV screen, urine microalbumin    HPI  Pt is here for follow up. Pt is here to inquire about Vasectomy.  He has 5 children ages 50 to 6. He and his wife do not wish to have more children. He denies any other issues at this time. He is in need of physical which will be scheduled at this visit.      Current Medication: Outpatient Encounter Medications as of 02/21/2020  Medication Sig  . [DISCONTINUED] azithromycin (ZITHROMAX) 250 MG tablet z-pack - take as directed for 5 days (Patient not taking: Reported on 02/21/2020)  . [DISCONTINUED] benzonatate (TESSALON) 100 MG capsule Take 1-2 capsules (100-200 mg total) by mouth 2 (two) times daily as needed for cough. (Patient not taking: Reported on 02/21/2020)  . [DISCONTINUED] cefdinir (OMNICEF) 300 MG capsule Take 1 capsule (300 mg total) by mouth 2 (two) times daily. (Patient not taking: Reported on 02/21/2020)  . [DISCONTINUED] chlorpheniramine-HYDROcodone (TUSSIONEX PENNKINETIC ER) 10-8 MG/5ML SUER Take 5 mLs by mouth every 12 (twelve) hours as needed for cough. (Patient not taking: Reported on 02/21/2020)   No facility-administered encounter medications on file as of 02/21/2020.    Surgical History: History reviewed. No pertinent surgical history.  Medical History: Past Medical History:  Diagnosis Date  . Hyperlipidemia   . Sleep disorder     Family History: Family History  Problem Relation Age of Onset  . Diabetes Mother   . Thyroid disease Mother   . Prostate cancer Maternal Uncle   . Diabetes Maternal Grandfather     Social History   Socioeconomic History  . Marital status: Married    Spouse name: Not on  file  . Number of children: Not on file  . Years of education: Not on file  . Highest education level: Not on file  Occupational History  . Not on file  Tobacco Use  . Smoking status: Never Smoker  . Smokeless tobacco: Never Used  Vaping Use  . Vaping Use: Never used  Substance and Sexual Activity  . Alcohol use: No  . Drug use: No  . Sexual activity: Yes  Other Topics Concern  . Not on file  Social History Narrative  . Not on file   Social Determinants of Health   Financial Resource Strain:   . Difficulty of Paying Living Expenses: Not on file  Food Insecurity:   . Worried About Programme researcher, broadcasting/film/video in the Last Year: Not on file  . Ran Out of Food in the Last Year: Not on file  Transportation Needs:   . Lack of Transportation (Medical): Not on file  . Lack of Transportation (Non-Medical): Not on file  Physical Activity:   . Days of Exercise per Week: Not on file  . Minutes of Exercise per Session: Not on file  Stress:   . Feeling of Stress : Not on file  Social Connections:   . Frequency of Communication with Friends and Family: Not on file  . Frequency of Social Gatherings with Friends and Family: Not on file  . Attends Religious Services: Not on file  . Active Member of Clubs or Organizations: Not on  file  . Attends Banker Meetings: Not on file  . Marital Status: Not on file  Intimate Partner Violence:   . Fear of Current or Ex-Partner: Not on file  . Emotionally Abused: Not on file  . Physically Abused: Not on file  . Sexually Abused: Not on file      Review of Systems  Constitutional: Negative.  Negative for chills, fatigue and unexpected weight change.  HENT: Negative.  Negative for congestion, rhinorrhea, sneezing and sore throat.   Eyes: Negative for redness.  Respiratory: Negative.  Negative for cough, chest tightness and shortness of breath.   Cardiovascular: Negative.  Negative for chest pain and palpitations.  Gastrointestinal:  Negative.  Negative for abdominal pain, constipation, diarrhea, nausea and vomiting.  Endocrine: Negative.   Genitourinary: Negative.  Negative for dysuria and frequency.  Musculoskeletal: Negative.  Negative for arthralgias, back pain, joint swelling and neck pain.  Skin: Negative.  Negative for rash.  Allergic/Immunologic: Negative.   Neurological: Negative.  Negative for tremors and numbness.  Hematological: Negative for adenopathy. Does not bruise/bleed easily.  Psychiatric/Behavioral: Negative.  Negative for behavioral problems, sleep disturbance and suicidal ideas. The patient is not nervous/anxious.     Vital Signs: BP 120/90   Pulse 76   Temp (!) 97.3 F (36.3 C)   Resp 16   Ht 5\' 10"  (1.778 m)   Wt 236 lb 12.8 oz (107.4 kg)   SpO2 96%   BMI 33.98 kg/m    Physical Exam Vitals and nursing note reviewed.  Constitutional:      General: He is not in acute distress.    Appearance: He is well-developed. He is not diaphoretic.  HENT:     Head: Normocephalic and atraumatic.     Mouth/Throat:     Pharynx: No oropharyngeal exudate.  Eyes:     Pupils: Pupils are equal, round, and reactive to light.  Neck:     Thyroid: No thyromegaly.     Vascular: No JVD.     Trachea: No tracheal deviation.  Cardiovascular:     Rate and Rhythm: Normal rate and regular rhythm.     Heart sounds: Normal heart sounds. No murmur heard.  No friction rub. No gallop.   Pulmonary:     Effort: Pulmonary effort is normal. No respiratory distress.     Breath sounds: Normal breath sounds. No wheezing or rales.  Chest:     Chest wall: No tenderness.  Abdominal:     Palpations: Abdomen is soft.     Tenderness: There is no abdominal tenderness. There is no guarding.  Musculoskeletal:        General: Normal range of motion.     Cervical back: Normal range of motion and neck supple.  Lymphadenopathy:     Cervical: No cervical adenopathy.  Skin:    General: Skin is warm and dry.  Neurological:      Mental Status: He is alert and oriented to person, place, and time.     Cranial Nerves: No cranial nerve deficit.  Psychiatric:        Behavior: Behavior normal.        Thought Content: Thought content normal.        Judgment: Judgment normal.     Assessment/Plan: 1. Vasectomy evaluation - Ambulatory referral to Urology  2. Hyperlipidemia, unspecified hyperlipidemia type Check lipid panel.  3. Encounter for general adult medical examination with abnormal findings Get physical labs for physical in a few weeks.  - CBC  with Differential/Platelet - Lipid Panel With LDL/HDL Ratio - TSH - T4, free - Comprehensive metabolic panel  4. Elevated random blood glucose level - POCT glycosylated hemoglobin (Hb A1C)  General Counseling: Marqual verbalizes understanding of the findings of todays visit and agrees with plan of treatment. I have discussed any further diagnostic evaluation that may be needed or ordered today. We also reviewed his medications today. he has been encouraged to call the office with any questions or concerns that should arise related to todays visit.    Orders Placed This Encounter  Procedures  . CBC with Differential/Platelet  . Lipid Panel With LDL/HDL Ratio  . TSH  . T4, free  . Comprehensive metabolic panel  . Ambulatory referral to Urology  . POCT glycosylated hemoglobin (Hb A1C)    No orders of the defined types were placed in this encounter.   Time spent: 30 Minutes   This patient was seen by Blima Ledger AGNP-C in Collaboration with Dr Lyndon Code as a part of collaborative care agreement     Johnna Acosta AGNP-C Internal medicine

## 2020-02-22 LAB — COMPREHENSIVE METABOLIC PANEL
ALT: 23 IU/L (ref 0–44)
AST: 18 IU/L (ref 0–40)
Albumin/Globulin Ratio: 1.9 (ref 1.2–2.2)
Albumin: 4.4 g/dL (ref 4.0–5.0)
Alkaline Phosphatase: 49 IU/L (ref 48–121)
BUN/Creatinine Ratio: 11 (ref 9–20)
BUN: 13 mg/dL (ref 6–24)
Bilirubin Total: 1.2 mg/dL (ref 0.0–1.2)
CO2: 25 mmol/L (ref 20–29)
Calcium: 9.6 mg/dL (ref 8.7–10.2)
Chloride: 101 mmol/L (ref 96–106)
Creatinine, Ser: 1.19 mg/dL (ref 0.76–1.27)
GFR calc Af Amer: 82 mL/min/{1.73_m2} (ref >59)
GFR calc non Af Amer: 71 mL/min/{1.73_m2} (ref >59)
Globulin, Total: 2.3 g/dL (ref 1.5–4.5)
Glucose: 88 mg/dL (ref 65–99)
Potassium: 4.6 mmol/L (ref 3.5–5.2)
Sodium: 140 mmol/L (ref 134–144)
Total Protein: 6.7 g/dL (ref 6.0–8.5)

## 2020-02-22 LAB — T4, FREE: Free T4: 1.17 ng/dL (ref 0.82–1.77)

## 2020-02-22 LAB — CBC WITH DIFFERENTIAL/PLATELET
Basophils Absolute: 0.1 10*3/uL (ref 0.0–0.2)
Basos: 1 %
EOS (ABSOLUTE): 0.1 10*3/uL (ref 0.0–0.4)
Eos: 1 %
Hematocrit: 45.3 % (ref 37.5–51.0)
Hemoglobin: 13.9 g/dL (ref 13.0–17.7)
Immature Grans (Abs): 0 10*3/uL (ref 0.0–0.1)
Immature Granulocytes: 0 %
Lymphocytes Absolute: 2.3 10*3/uL (ref 0.7–3.1)
Lymphs: 39 %
MCH: 22.2 pg — ABNORMAL LOW (ref 26.6–33.0)
MCHC: 30.7 g/dL — ABNORMAL LOW (ref 31.5–35.7)
MCV: 72 fL — ABNORMAL LOW (ref 79–97)
Monocytes Absolute: 0.4 10*3/uL (ref 0.1–0.9)
Monocytes: 7 %
Neutrophils Absolute: 3.1 10*3/uL (ref 1.4–7.0)
Neutrophils: 52 %
Platelets: 284 10*3/uL (ref 150–450)
RBC: 6.26 x10E6/uL — ABNORMAL HIGH (ref 4.14–5.80)
RDW: 16.2 % — ABNORMAL HIGH (ref 11.6–15.4)
WBC: 5.9 10*3/uL (ref 3.4–10.8)

## 2020-02-22 LAB — LIPID PANEL WITH LDL/HDL RATIO
Cholesterol, Total: 199 mg/dL (ref 100–199)
HDL: 38 mg/dL — ABNORMAL LOW (ref >39)
LDL Chol Calc (NIH): 150 mg/dL — ABNORMAL HIGH (ref 0–99)
LDL/HDL Ratio: 3.9 ratio — ABNORMAL HIGH (ref 0.0–3.6)
Triglycerides: 62 mg/dL (ref 0–149)
VLDL Cholesterol Cal: 11 mg/dL (ref 5–40)

## 2020-02-22 LAB — TSH: TSH: 1.93 u[IU]/mL (ref 0.450–4.500)

## 2020-03-02 ENCOUNTER — Ambulatory Visit: Payer: 59 | Admitting: Urology

## 2020-03-02 ENCOUNTER — Encounter: Payer: Self-pay | Admitting: Urology

## 2020-03-02 ENCOUNTER — Other Ambulatory Visit: Payer: Self-pay

## 2020-03-02 VITALS — BP 137/71 | HR 82 | Ht 70.0 in | Wt 230.0 lb

## 2020-03-02 DIAGNOSIS — Z3009 Encounter for other general counseling and advice on contraception: Secondary | ICD-10-CM

## 2020-03-02 MED ORDER — DIAZEPAM 10 MG PO TABS: 10.0000 mg | ORAL_TABLET | Freq: Once | ORAL | 0 refills | Status: DC | PRN

## 2020-03-02 NOTE — Patient Instructions (Signed)

## 2020-03-02 NOTE — Progress Notes (Signed)
   03/02/20 10:30 AM   Noah Cummings 1971-02-25 [id number]  CC: Discuss vasectomy  HPI: I saw Noah Cummings in urology clinic for evaluation of vasectomy.  He is a healthy 49 year old male with 5 children ranging from age 51-31, and he and his wife are interested in vasectomy for permanent sterilization.  He denies any family history of prostate cancer.  Social History:  reports that he has never smoked. He has never used smokeless tobacco. He reports that he does not drink alcohol and does not use drugs.  Physical Exam: BP 137/71   Pulse 82   Ht 5\' 10"  (1.778 m)   Wt 230 lb (104.3 kg)   BMI 33.00 kg/m    Constitutional:  Alert and oriented, No acute distress. Cardiovascular: No clubbing, cyanosis, or edema. Respiratory: Normal respiratory effort, no increased work of breathing. GI: Abdomen is soft, nontender, nondistended, no abdominal masses GU: Circumcised phallus with patent meatus, right vas deferens easily palpable, left cord thickened but vas deferens palpable   Assessment & Plan:   Healthy 49 year old male who desires vasectomy for permanent sterilization.  We discussed the risks and benefits of vasectomy at length.  Vasectomy is intended to be a permanent form of contraception, and does not produce immediate sterility.  Following vasectomy another form of contraception is required until vas occlusion is confirmed by a post-vasectomy semen analysis obtained 2-3 months after the procedure.  Even after vas occlusion is confirmed, vasectomy is not 100% reliable in preventing pregnancy, and the failure rate is approximately 07/1998.  Repeat vasectomy is required in less than 1% of patients.  He should refrain from ejaculation for 1 week after vasectomy.  Options for fertility after vasectomy include vasectomy reversal, and sperm retrieval with in vitro fertilization or ICSI.  These options are not always successful and may be expensive.  Finally, there are other permanent and  non-permanent alternatives to vasectomy available. There is no risk of erectile dysfunction, and the volume of semen will be similar to prior, as the majority of the ejaculate is from the prostate and seminal vesicles.   The procedure takes ~20 minutes.  We recommend patients take 5-10 mg of Valium 30 minutes prior, and he will need a driver post-procedure.  Local anesthetic is injected into the scrotal skin and a small segment of the vas deferens is removed, and the ends occluded. The complication rate is approximately 1-2%, and includes bleeding, infection, and development of chronic scrotal pain.  PLAN: Pending insurance approval, will schedule for vasectomy at his convenience    08/1998, MD 03/02/2020  [facility] [address] [place], [place] [phone]

## 2020-03-13 ENCOUNTER — Ambulatory Visit: Payer: 59 | Admitting: Adult Health

## 2020-03-16 ENCOUNTER — Telehealth: Payer: Self-pay

## 2020-03-16 NOTE — Telephone Encounter (Signed)
Unable to LVM for OV on 9/20 

## 2020-03-20 ENCOUNTER — Encounter: Payer: 59 | Admitting: Internal Medicine

## 2020-03-23 ENCOUNTER — Encounter: Payer: Self-pay | Admitting: Urology

## 2020-04-27 ENCOUNTER — Encounter: Payer: Self-pay | Admitting: Urology

## 2020-04-28 ENCOUNTER — Encounter: Payer: Self-pay | Admitting: Urology

## 2020-06-27 ENCOUNTER — Other Ambulatory Visit: Payer: Self-pay | Admitting: Hospice and Palliative Medicine

## 2020-08-24 ENCOUNTER — Telehealth: Payer: Self-pay

## 2020-08-24 NOTE — Telephone Encounter (Signed)
Patient's wife called requesting patient to have a referral for GI for colonoscopy and I explained to her that he needs to be seen even though his insurance may not require a referral authorization that his GI office is requiring a referral and in order to get a referral he needs to be seen so we can document the notes and send a referral. We can not process a referral without having a current visit. Kylei Purington

## 2021-03-14 DIAGNOSIS — K5730 Diverticulosis of large intestine without perforation or abscess without bleeding: Secondary | ICD-10-CM | POA: Diagnosis not present

## 2021-03-14 DIAGNOSIS — Z1211 Encounter for screening for malignant neoplasm of colon: Secondary | ICD-10-CM | POA: Diagnosis not present

## 2021-03-14 DIAGNOSIS — K635 Polyp of colon: Secondary | ICD-10-CM | POA: Diagnosis not present

## 2021-03-14 DIAGNOSIS — D124 Benign neoplasm of descending colon: Secondary | ICD-10-CM | POA: Diagnosis not present

## 2021-06-27 DIAGNOSIS — Z20822 Contact with and (suspected) exposure to covid-19: Secondary | ICD-10-CM | POA: Diagnosis not present

## 2022-04-10 ENCOUNTER — Telehealth: Payer: Self-pay | Admitting: Nurse Practitioner

## 2022-04-10 NOTE — Telephone Encounter (Signed)
Left vm to confirm 04/17/22 appointment-Noah Cummings

## 2022-04-17 ENCOUNTER — Encounter: Payer: BC Managed Care – PPO | Admitting: Nurse Practitioner

## 2022-05-03 ENCOUNTER — Encounter: Payer: BC Managed Care – PPO | Admitting: Nurse Practitioner

## 2022-05-20 ENCOUNTER — Telehealth: Payer: Self-pay | Admitting: Internal Medicine

## 2022-05-20 NOTE — Telephone Encounter (Signed)
Left vm to confirm 05/29/22 appointment-Toni

## 2022-05-29 ENCOUNTER — Encounter: Payer: Self-pay | Admitting: Nurse Practitioner

## 2022-05-29 ENCOUNTER — Ambulatory Visit (INDEPENDENT_AMBULATORY_CARE_PROVIDER_SITE_OTHER): Payer: BC Managed Care – PPO | Admitting: Nurse Practitioner

## 2022-05-29 VITALS — BP 134/88 | HR 93 | Temp 97.2°F | Resp 16 | Ht 70.0 in | Wt 239.2 lb

## 2022-05-29 DIAGNOSIS — R300 Dysuria: Secondary | ICD-10-CM

## 2022-05-29 DIAGNOSIS — E782 Mixed hyperlipidemia: Secondary | ICD-10-CM

## 2022-05-29 DIAGNOSIS — Z0001 Encounter for general adult medical examination with abnormal findings: Secondary | ICD-10-CM

## 2022-05-29 DIAGNOSIS — G8929 Other chronic pain: Secondary | ICD-10-CM

## 2022-05-29 DIAGNOSIS — D563 Thalassemia minor: Secondary | ICD-10-CM

## 2022-05-29 DIAGNOSIS — Z125 Encounter for screening for malignant neoplasm of prostate: Secondary | ICD-10-CM

## 2022-05-29 DIAGNOSIS — M545 Low back pain, unspecified: Secondary | ICD-10-CM | POA: Diagnosis not present

## 2022-05-29 NOTE — Progress Notes (Signed)
[facility] [place], [place] [address]  Internal MEDICINE  Office Visit Note  Patient Name: Noah Cummings  [id number]  [id number]  Date of Service: 05/29/2022  Chief Complaint  Patient presents with   Annual Exam   Hyperlipidemia    HPI Noah Cummings presents for an annual well visit and physical exam.  Well-appearing 50 y.o. male with back pain, beta thalassemia trait, and high cholesterol Routine CRC screening: 03/14/21 Labs: overdue, including PSA New or worsening pain: chronic back pain Other concerns: has hx of beta thalassemia trait    Current Medication: Outpatient Encounter Medications as of 05/29/2022  Medication Sig   diazepam (VALIUM) 10 MG tablet Take 1 tablet (10 mg total) by mouth once as needed for up to 1 dose for anxiety (take 30 minutes prior to vasectomy).   No facility-administered encounter medications on file as of 05/29/2022.    Surgical History: Past Surgical History:  Procedure Laterality Date   none      Medical History: Past Medical History:  Diagnosis Date   Hyperlipidemia    Sleep disorder     Family History: Family History  Problem Relation Age of Onset   Diabetes Mother    Thyroid disease Mother    Prostate cancer Maternal Uncle    Diabetes Maternal Grandfather    Bladder Cancer Neg Hx    Kidney cancer Neg Hx     Social History   Socioeconomic History   Marital status: Married    Spouse name: Not on file   Number of children: Not on file   Years of education: Not on file   Highest education level: Not on file  Occupational History   Not on file  Tobacco Use   Smoking status: Never   Smokeless tobacco: Never  Vaping Use   Vaping Use: Never used  Substance and Sexual Activity   Alcohol use: No   Drug use: No   Sexual activity: Yes  Other Topics Concern   Not on file  Social History Narrative   Not on file   Social Determinants of Health   Financial Resource Strain: Not on file  Food  Insecurity: Not on file  Transportation Needs: Not on file  Physical Activity: Not on file  Stress: Not on file  Social Connections: Not on file  Intimate Partner Violence: Not on file      Review of Systems  Constitutional:  Negative for activity change, appetite change, chills, fatigue, fever and unexpected weight change.  HENT: Negative.  Negative for congestion, ear pain, rhinorrhea, sore throat and trouble swallowing.   Eyes: Negative.   Respiratory: Negative.  Negative for cough, chest tightness, shortness of breath and wheezing.   Cardiovascular: Negative.  Negative for chest pain and palpitations.  Gastrointestinal: Negative.  Negative for abdominal pain, blood in stool, constipation, diarrhea, nausea and vomiting.  Endocrine: Negative.   Genitourinary: Negative.  Negative for difficulty urinating, dysuria, frequency, hematuria and urgency.  Musculoskeletal: Negative.  Negative for arthralgias, back pain, joint swelling, myalgias and neck pain.  Skin: Negative.  Negative for rash and wound.  Allergic/Immunologic: Negative.  Negative for immunocompromised state.  Neurological: Negative.  Negative for dizziness, seizures, numbness and headaches.  Hematological: Negative.   Psychiatric/Behavioral: Negative.  Negative for behavioral problems, self-injury and suicidal ideas. The patient is not nervous/anxious.     Vital Signs: BP 134/88   Pulse 93   Temp (!) 97.2 F (36.2 C)   Resp 16   Ht 5'  10" (1.778 m)   Wt 239 lb 3.2 oz (108.5 kg)   SpO2 97%   BMI 34.32 kg/m    Physical Exam Vitals reviewed.  Constitutional:      General: Noah Cummings is awake. Noah Cummings is not in acute distress.    Appearance: Normal appearance. Noah Cummings is well-developed and well-groomed. Noah Cummings is obese. Noah Cummings is not ill-appearing or diaphoretic.  HENT:     Head: Normocephalic and atraumatic.     Right Ear: Tympanic membrane, ear canal and external ear normal.     Left Ear: Tympanic membrane, ear canal and external ear  normal.     Nose: Nose normal. No congestion or rhinorrhea.     Mouth/Throat:     Mouth: Mucous membranes are moist.     Pharynx: Oropharynx is clear. No oropharyngeal exudate or posterior oropharyngeal erythema.  Eyes:     General: Lids are normal. Vision grossly intact. Gaze aligned appropriately. No scleral icterus.       Right eye: No discharge.        Left eye: No discharge.     Extraocular Movements: Extraocular movements intact.     Conjunctiva/sclera: Conjunctivae normal.     Pupils: Pupils are equal, round, and reactive to light.  Neck:     Thyroid: No thyromegaly.     Vascular: No JVD.     Trachea: Trachea and phonation normal. No tracheal deviation.  Cardiovascular:     Rate and Rhythm: Normal rate and regular rhythm.     Pulses: Normal pulses.     Heart sounds: Normal heart sounds, S1 normal and S2 normal. No murmur heard.    No friction rub. No gallop.  Pulmonary:     Effort: Pulmonary effort is normal. No accessory muscle usage or respiratory distress.     Breath sounds: Normal breath sounds and air entry. No stridor. No wheezing or rales.  Chest:     Chest wall: No tenderness.  Abdominal:     General: Bowel sounds are normal. There is no distension.     Palpations: Abdomen is soft. There is no mass.     Tenderness: There is no abdominal tenderness. There is no guarding or rebound.  Musculoskeletal:        General: No tenderness or deformity. Normal range of motion.     Cervical back: Normal range of motion and neck supple.  Lymphadenopathy:     Cervical: No cervical adenopathy.  Skin:    General: Skin is warm and dry.     Capillary Refill: Capillary refill takes less than 2 seconds.     Coloration: Skin is not pale.     Findings: No erythema or rash.  Neurological:     Mental Status: Noah Cummings is alert and oriented to person, place, and time.     Cranial Nerves: No cranial nerve deficit.     Motor: No abnormal muscle tone.     Coordination: Coordination normal.      Gait: Gait normal.     Deep Tendon Reflexes: Reflexes are normal and symmetric.  Psychiatric:        Mood and Affect: Mood and affect normal.        Behavior: Behavior normal. Behavior is cooperative.        Thought Content: Thought content normal.        Judgment: Judgment normal.        Assessment/Plan: 1. Encounter for routine adult health examination with abnormal findings Age-appropriate preventive screenings and vaccinations discussed, annual physical  exam completed. Routine labs for health maintenance ordered, see below. PHM updated.  - Lipid Profile - Urinalysis, Routine w reflex microscopic - PSA Total (Reflex To Free) - CBC with Differential/Platelet - CMP14+EGFR  2. Mixed hyperlipidemia Routine labs ordered - Lipid Profile - CMP14+EGFR  3. Chronic low back pain, unspecified back pain laterality, unspecified whether sciatica present Improved, tolerable   4. Beta thalassemia trait Routine labs ordered - CBC with Differential/Platelet  5. Screening for prostate cancer Routine lab ordered - PSA Total (Reflex To Free)  6. Dysuria Routine urinalysis done  - Urinalysis, Routine w reflex microscopic      General Counseling: Najee verbalizes understanding of the findings of todays visit and agrees with plan of treatment. I have discussed any further diagnostic evaluation that may be needed or ordered today. We also reviewed his medications today. Noah Cummings has been encouraged to call the office with any questions or concerns that should arise related to todays visit.    Orders Placed This Encounter  Procedures   Lipid Profile   Urinalysis, Routine w reflex microscopic   PSA Total (Reflex To Free)   CBC with Differential/Platelet   CMP14+EGFR    No orders of the defined types were placed in this encounter.   Return in about 1 year (around 05/30/2023) for CPE, [facility] PCP.   Total time spent:30 Minutes Time spent includes review of chart, medications, test  results, and follow up plan with the patient.   [place] Controlled Substance Database was reviewed by me.  This patient was seen by Jonetta Osgood, FNP-C in collaboration with Dr. Clayborn Bigness as a part of collaborative care agreement.  Alyssa R. Valetta Fuller, MSN, FNP-C Internal medicine

## 2022-05-30 LAB — URINALYSIS, ROUTINE W REFLEX MICROSCOPIC
Bilirubin, UA: NEGATIVE
Glucose, UA: NEGATIVE
Ketones, UA: NEGATIVE
Leukocytes,UA: NEGATIVE
Nitrite, UA: NEGATIVE
Protein,UA: NEGATIVE
RBC, UA: NEGATIVE
Specific Gravity, UA: >=1.03 — AB (ref 1.005–1.030)
Urobilinogen, Ur: 1 mg/dL (ref 0.2–1.0)
pH, UA: 5.5 (ref 5.0–7.5)

## 2022-06-07 ENCOUNTER — Telehealth: Payer: Self-pay

## 2022-06-07 DIAGNOSIS — R200 Anesthesia of skin: Secondary | ICD-10-CM | POA: Diagnosis not present

## 2022-06-07 DIAGNOSIS — R079 Chest pain, unspecified: Secondary | ICD-10-CM | POA: Diagnosis not present

## 2022-06-07 DIAGNOSIS — M79602 Pain in left arm: Secondary | ICD-10-CM | POA: Diagnosis not present

## 2022-06-07 DIAGNOSIS — R0789 Other chest pain: Secondary | ICD-10-CM | POA: Diagnosis not present

## 2022-06-07 DIAGNOSIS — I444 Left anterior fascicular block: Secondary | ICD-10-CM | POA: Diagnosis not present

## 2022-06-07 DIAGNOSIS — R918 Other nonspecific abnormal finding of lung field: Secondary | ICD-10-CM | POA: Diagnosis not present

## 2022-06-07 DIAGNOSIS — R0683 Snoring: Secondary | ICD-10-CM | POA: Diagnosis not present

## 2022-06-07 DIAGNOSIS — R778 Other specified abnormalities of plasma proteins: Secondary | ICD-10-CM | POA: Diagnosis not present

## 2022-06-07 DIAGNOSIS — R0602 Shortness of breath: Secondary | ICD-10-CM | POA: Diagnosis not present

## 2022-06-07 DIAGNOSIS — R110 Nausea: Secondary | ICD-10-CM | POA: Diagnosis not present

## 2022-06-07 NOTE — Telephone Encounter (Signed)
Patient's wife called to report that patient has been having chest pain that at times causes him to have to hold his chest. Per Alyssa, advised patient and his wife to go to the ED.

## 2022-06-08 DIAGNOSIS — R079 Chest pain, unspecified: Secondary | ICD-10-CM | POA: Diagnosis not present

## 2022-06-10 DIAGNOSIS — R0600 Dyspnea, unspecified: Secondary | ICD-10-CM | POA: Diagnosis not present

## 2022-06-10 DIAGNOSIS — E782 Mixed hyperlipidemia: Secondary | ICD-10-CM | POA: Diagnosis not present

## 2022-06-10 DIAGNOSIS — R072 Precordial pain: Secondary | ICD-10-CM | POA: Diagnosis not present

## 2022-06-11 ENCOUNTER — Ambulatory Visit: Payer: BC Managed Care – PPO | Admitting: Nurse Practitioner

## 2022-06-12 ENCOUNTER — Ambulatory Visit (INDEPENDENT_AMBULATORY_CARE_PROVIDER_SITE_OTHER): Payer: BC Managed Care – PPO | Admitting: Nurse Practitioner

## 2022-06-12 ENCOUNTER — Encounter: Payer: Self-pay | Admitting: Nurse Practitioner

## 2022-06-12 VITALS — BP 123/76 | HR 72 | Temp 98.3°F | Resp 16 | Ht 70.0 in | Wt 236.6 lb

## 2022-06-12 DIAGNOSIS — F411 Generalized anxiety disorder: Secondary | ICD-10-CM

## 2022-06-12 DIAGNOSIS — R079 Chest pain, unspecified: Secondary | ICD-10-CM

## 2022-06-12 NOTE — Progress Notes (Signed)
[facility] [address] [place], [place] [address]  Internal MEDICINE  Office Visit Note  Patient Name: Noah Cummings  [id number]  [id number]  Date of Service: 06/12/2022  Chief Complaint  Patient presents with   Follow-up    F/u ED-chest pain    Hyperlipidemia    HPI Kemon presents for a follow up visit for recent ED visit for chest pain.  --had chest pain, abnormal EKG, was referred to cardiology and seen by them 2 days ago. Coronary artery testing and an echocardiogram have been ordered and scheduled for early January. --high level of anxiety -- worried about heart and other things out of his control, see GAD7 screening results below (total of 19). Not interested in medication for anxiety at this time but would like to see a therapist        06/12/2022   11:03 AM  GAD 7 : Generalized Anxiety Score  Nervous, Anxious, on Edge 3  Control/stop worrying 3  Worry too much - different things 3  Trouble relaxing 3  Restless 3  Easily annoyed or irritable 1  Afraid - awful might happen 3  Total GAD 7 Score 19  Anxiety Difficulty Somewhat difficult       Current Medication: Outpatient Encounter Medications as of 06/12/2022  Medication Sig   diazepam (VALIUM) 10 MG tablet Take 1 tablet (10 mg total) by mouth once as needed for up to 1 dose for anxiety (take 30 minutes prior to vasectomy).   No facility-administered encounter medications on file as of 06/12/2022.    Surgical History: Past Surgical History:  Procedure Laterality Date   none      Medical History: Past Medical History:  Diagnosis Date   Hyperlipidemia    Sleep disorder     Family History: Family History  Problem Relation Age of Onset   Diabetes Mother    Thyroid disease Mother    Prostate cancer Maternal Uncle    Diabetes Maternal Grandfather    Bladder Cancer Neg Hx    Kidney cancer Neg Hx     Social History   Socioeconomic History   Marital status: Married     Spouse name: Not on file   Number of children: Not on file   Years of education: Not on file   Highest education level: Not on file  Occupational History   Not on file  Tobacco Use   Smoking status: Never   Smokeless tobacco: Never  Vaping Use   Vaping Use: Never used  Substance and Sexual Activity   Alcohol use: No   Drug use: No   Sexual activity: Yes  Other Topics Concern   Not on file  Social History Narrative   Not on file   Social Determinants of Health   Financial Resource Strain: Not on file  Food Insecurity: Not on file  Transportation Needs: Not on file  Physical Activity: Not on file  Stress: Not on file  Social Connections: Not on file  Intimate Partner Violence: Not on file      Review of Systems  Constitutional:  Negative for chills, fatigue and unexpected weight change.  HENT:  Negative for congestion, postnasal drip, rhinorrhea, sneezing and sore throat.   Eyes:  Negative for redness.  Respiratory:  Negative for cough, chest tightness and shortness of breath.   Cardiovascular:  Positive for chest pain (not presently but had recent ED visit). Negative for palpitations.  Gastrointestinal:  Negative for abdominal pain, constipation, diarrhea, nausea and  vomiting.  Genitourinary:  Negative for dysuria and frequency.  Musculoskeletal:  Negative for arthralgias, back pain, joint swelling and neck pain.  Skin:  Negative for rash.  Neurological: Negative.  Negative for tremors and numbness.  Hematological:  Negative for adenopathy. Does not bruise/bleed easily.  Psychiatric/Behavioral:  Positive for decreased concentration and sleep disturbance. Negative for behavioral problems (Depression) and suicidal ideas. The patient is nervous/anxious.     Vital Signs: BP 123/76   Pulse 72   Temp 98.3 F (36.8 C)   Resp 16   Ht 5\' 10"  (1.778 m)   Wt 236 lb 9.6 oz (107.3 kg)   SpO2 97%   BMI 33.95 kg/m    Physical Exam Vitals reviewed.  Constitutional:       General: He is not in acute distress.    Appearance: Normal appearance. He is obese. He is not ill-appearing.  HENT:     Head: Normocephalic and atraumatic.  Eyes:     Pupils: Pupils are equal, round, and reactive to light.  Cardiovascular:     Rate and Rhythm: Normal rate and regular rhythm.  Pulmonary:     Effort: Pulmonary effort is normal. No respiratory distress.  Neurological:     Mental Status: He is alert and oriented to person, place, and time.  Psychiatric:        Mood and Affect: Mood is anxious.        Behavior: Behavior normal.        Assessment/Plan: 1. Chest pain, unspecified type Following up with cardiology, scheduled for further testing in january  2. GAD (generalized anxiety disorder) Referred for therapy to discuss anxiety, not interested in medication at this time.  - Ambulatory referral to Psychology   General Counseling: mathayus stanbery understanding of the findings of todays visit and agrees with plan of treatment. I have discussed any further diagnostic evaluation that may be needed or ordered today. We also reviewed his medications today. he has been encouraged to call the office with any questions or concerns that should arise related to todays visit.    Orders Placed This Encounter  Procedures   Ambulatory referral to Psychology    No orders of the defined types were placed in this encounter.   Return if symptoms worsen or fail to improve.   Total time spent:20 Minutes Time spent includes review of chart, medications, test results, and follow up plan with the patient.   [place] Controlled Substance Database was reviewed by me.  This patient was seen by Isaiah Serge, FNP-C in collaboration with Dr. Sallyanne Kuster as a part of collaborative care agreement.   Hoy Fallert R. Beverely Risen, MSN, FNP-C Internal medicine

## 2022-06-17 ENCOUNTER — Telehealth: Payer: Self-pay | Admitting: Nurse Practitioner

## 2022-06-17 NOTE — Telephone Encounter (Signed)
BH referral faxed to Dr. Kapur; [phone]-Toni 

## 2022-06-19 DIAGNOSIS — R0609 Other forms of dyspnea: Secondary | ICD-10-CM | POA: Diagnosis not present

## 2022-06-19 DIAGNOSIS — I371 Nonrheumatic pulmonary valve insufficiency: Secondary | ICD-10-CM | POA: Diagnosis not present

## 2022-06-19 DIAGNOSIS — R0600 Dyspnea, unspecified: Secondary | ICD-10-CM | POA: Diagnosis not present

## 2022-06-19 DIAGNOSIS — R079 Chest pain, unspecified: Secondary | ICD-10-CM | POA: Diagnosis not present

## 2022-06-19 DIAGNOSIS — I071 Rheumatic tricuspid insufficiency: Secondary | ICD-10-CM | POA: Diagnosis not present

## 2022-06-20 ENCOUNTER — Telehealth: Payer: Self-pay | Admitting: Nurse Practitioner

## 2022-06-20 NOTE — Telephone Encounter (Signed)
Received disability paperwork from [employer]. Gave to Alyssa to complete-Toni 

## 2022-06-26 ENCOUNTER — Telehealth: Payer: Self-pay | Admitting: Nurse Practitioner

## 2022-06-26 ENCOUNTER — Other Ambulatory Visit: Payer: Self-pay | Admitting: Nurse Practitioner

## 2022-06-26 NOTE — Telephone Encounter (Signed)
Disability paperwork completed. Faxed back to [employer] and Disability; [phone]

## 2022-06-28 DIAGNOSIS — R072 Precordial pain: Secondary | ICD-10-CM | POA: Diagnosis not present

## 2022-06-28 DIAGNOSIS — I2584 Coronary atherosclerosis due to calcified coronary lesion: Secondary | ICD-10-CM | POA: Diagnosis not present

## 2022-07-04 ENCOUNTER — Telehealth: Payer: Self-pay | Admitting: Nurse Practitioner

## 2022-07-04 NOTE — Telephone Encounter (Signed)
Pt called stated that his Disability paperwork was not received so it was re scanned to [place]; [phone]

## 2022-07-09 DIAGNOSIS — I2584 Coronary atherosclerosis due to calcified coronary lesion: Secondary | ICD-10-CM | POA: Diagnosis not present

## 2022-07-09 DIAGNOSIS — I2510 Atherosclerotic heart disease of native coronary artery without angina pectoris: Secondary | ICD-10-CM | POA: Diagnosis not present

## 2022-07-09 DIAGNOSIS — R079 Chest pain, unspecified: Secondary | ICD-10-CM | POA: Diagnosis not present

## 2022-07-09 DIAGNOSIS — E7849 Other hyperlipidemia: Secondary | ICD-10-CM | POA: Diagnosis not present

## 2022-07-11 ENCOUNTER — Other Ambulatory Visit: Payer: Self-pay | Admitting: Nurse Practitioner

## 2022-07-11 MED ORDER — ALPRAZOLAM 0.25 MG PO TABS: 0.2500 mg | ORAL_TABLET | Freq: Two times a day (BID) | ORAL | 0 refills | Status: DC | PRN

## 2022-07-12 ENCOUNTER — Telehealth: Payer: Self-pay | Admitting: Nurse Practitioner

## 2022-07-12 NOTE — Telephone Encounter (Signed)
Received Disability & Leave forms from [facility]. Gave to Alyssa to be completed

## 2022-07-15 ENCOUNTER — Telehealth: Payer: Self-pay | Admitting: Nurse Practitioner

## 2022-07-15 NOTE — Telephone Encounter (Signed)
Disability & Leave forms from [facility] faxed [phone]. Scanned-Toni.

## 2022-07-16 ENCOUNTER — Ambulatory Visit (INDEPENDENT_AMBULATORY_CARE_PROVIDER_SITE_OTHER): Payer: BC Managed Care – PPO | Admitting: Nurse Practitioner

## 2022-07-16 ENCOUNTER — Encounter: Payer: Self-pay | Admitting: Nurse Practitioner

## 2022-07-16 VITALS — BP 120/72 | HR 81 | Temp 98.4°F | Resp 16 | Ht 70.0 in | Wt 244.6 lb

## 2022-07-16 DIAGNOSIS — R079 Chest pain, unspecified: Secondary | ICD-10-CM | POA: Diagnosis not present

## 2022-07-16 DIAGNOSIS — F411 Generalized anxiety disorder: Secondary | ICD-10-CM

## 2022-07-16 DIAGNOSIS — F410 Panic disorder [episodic paroxysmal anxiety] without agoraphobia: Secondary | ICD-10-CM | POA: Diagnosis not present

## 2022-07-16 NOTE — Progress Notes (Addendum)
[facility] [place], [place] [address]  Internal MEDICINE  Office Visit Note  Patient Name: Noah Cummings  [id number]  [id number]  Date of Service: 07/16/2022  Chief Complaint  Patient presents with   Acute Visit   Anxiety    Causing lots of chest pain, having to use the restroom more often. Had panic attack on 07/10/2022.     HPI Noah Cummings presents for an acute sick visit for anxiety and panic attack.  --on 07/10/22, patient had a panic attack which caused him significant chest pain and shortness of breath. To calm himself down, he walked away and had to do some deep breathing. Has has had some additional moments of severe anxiety as well and this causes shortness of breath and chest pain. -- as needed alprazolam helps --if necessary, he may need a maintenance medication in addition to the prn alprazolam.   --anxiety is related to toxic work relationship with a specific person.  --seeing psychiatrist Dr. Nicolasa Cummings next month and is seeing a therapist as well.      Current Medication:  Outpatient Encounter Medications as of 07/16/2022  Medication Sig   ALPRAZolam (XANAX) 0.25 MG tablet Take 1 tablet (0.25 mg total) by mouth 2 (two) times daily as needed (severe anxiety/panic).   No facility-administered encounter medications on file as of 07/16/2022.      Medical History: Past Medical History:  Diagnosis Date   Hyperlipidemia    Sleep disorder      Vital Signs: BP 120/72   Pulse 81   Temp 98.4 F (36.9 C)   Resp 16   Ht 5\' 10"  (1.778 m)   Wt 244 lb 9.6 oz (110.9 kg)   SpO2 99%   BMI 35.10 kg/m    Review of Systems  Constitutional:  Positive for fatigue. Negative for chills and unexpected weight change.  HENT:  Negative for congestion, postnasal drip, rhinorrhea, sneezing and sore throat.   Eyes:  Negative for redness.  Respiratory:  Negative for cough, chest tightness, shortness of breath and wheezing.   Cardiovascular:  Positive for  chest pain (not presently but had recent ED visit) and palpitations.  Gastrointestinal:  Negative for abdominal pain, constipation, diarrhea, nausea and vomiting.  Genitourinary:  Negative for dysuria and frequency.  Musculoskeletal:  Negative for arthralgias, back pain, joint swelling and neck pain.  Skin:  Negative for rash.  Neurological: Negative.  Negative for tremors and numbness.  Hematological:  Negative for adenopathy. Does not bruise/bleed easily.  Psychiatric/Behavioral:  Positive for decreased concentration and sleep disturbance. Negative for behavioral problems (Depression), self-injury and suicidal ideas. The patient is nervous/anxious.     Physical Exam Vitals reviewed.  Constitutional:      General: He is not in acute distress.    Appearance: Normal appearance. He is obese. He is not ill-appearing.  HENT:     Head: Normocephalic and atraumatic.  Eyes:     Pupils: Pupils are equal, round, and reactive to light.     Comments: Dark circles under eyes   Cardiovascular:     Rate and Rhythm: Normal rate and regular rhythm.  Pulmonary:     Effort: Pulmonary effort is normal. No respiratory distress.  Neurological:     Mental Status: He is alert and oriented to person, place, and time.  Psychiatric:        Mood and Affect: Mood is anxious. Affect is tearful.        Behavior: Behavior normal.  Thought Content: Thought content is not paranoid. Thought content does not include homicidal or suicidal ideation.       Assessment/Plan: 1. Chest pain, unspecified type Related to anxiety, intermittent, has episodes of chest pain when he is experiencing severe anxiety or panic attack.   2. GAD (generalized anxiety disorder) Continue alprazolam prn as prescribed. Consider adding a maintenance medication if necessary. Recommend to continue being out of work for an extended period of time. Continue therapy sessions and go to scheduled office visit with Dr. Nicolasa Cummings next month.     3. Panic attack Severe panic attack with somatic manifestations of chest pain and shortness of breath   General Counseling: Noah Cummings verbalizes understanding of the findings of todays visit and agrees with plan of treatment. I have discussed any further diagnostic evaluation that may be needed or ordered today. We also reviewed his medications today. he has been encouraged to call the office with any questions or concerns that should arise related to todays visit.    Counseling:    No orders of the defined types were placed in this encounter.   No orders of the defined types were placed in this encounter.   Return in about 2 months (around 09/14/2022) for F/U, Noah Cummings PCP.  [place] Controlled Substance Database was reviewed by me for overdose risk score (ORS)  Time spent:20 Minutes Time spent with patient included reviewing progress notes, labs, imaging studies, and discussing plan for follow up.   This patient was seen by Noah Osgood, FNP-C in collaboration with Dr. Clayborn Cummings as a part of collaborative care agreement.  Noah Cummings R. Noah Fuller, MSN, FNP-C Internal Medicine

## 2022-07-17 ENCOUNTER — Telehealth: Payer: Self-pay | Admitting: Nurse Practitioner

## 2022-07-17 NOTE — Addendum Note (Signed)
Addended by: Jonetta Osgood on: 07/17/2022 01:55 PM   Modules accepted: Level of Service

## 2022-07-17 NOTE — Telephone Encounter (Signed)
Last disability document and last office note emailed to [email or web address] since they stated they did not receive via fax-Toni

## 2022-07-29 ENCOUNTER — Telehealth: Payer: Self-pay | Admitting: Nurse Practitioner

## 2022-07-29 NOTE — Telephone Encounter (Signed)
Received call from patient's wife,stating patient did not receive check due to [employer] not receiving requested paperwork & office notes. I call [employer], spoke with Coralyn Mark. She stated they have received everything from our office. Patient not receiving check due to they believe his diagnosis is within normal range and do not see why he needs to be out of work-Toni

## 2022-07-31 ENCOUNTER — Telehealth: Payer: Self-pay | Admitting: Nurse Practitioner

## 2022-07-31 NOTE — Telephone Encounter (Signed)
Notified Kia, that Updated disability & Leave form was faxed back to [employer]-Toni

## 2022-07-31 NOTE — Telephone Encounter (Signed)
Updated disability & Leave form & 07/16/22 office visit notes faxed back to [facility]; [phone]. Scanned-Toni

## 2022-08-01 ENCOUNTER — Telehealth: Payer: Self-pay | Admitting: Nurse Practitioner

## 2022-08-01 NOTE — Telephone Encounter (Signed)
Office notes, labs, H&P, med list faxed to Dr. Nicolasa Ducking; [phone]

## 2022-08-05 ENCOUNTER — Telehealth: Payer: Self-pay

## 2022-08-05 DIAGNOSIS — R45851 Suicidal ideations: Secondary | ICD-10-CM | POA: Diagnosis not present

## 2022-08-05 DIAGNOSIS — E785 Hyperlipidemia, unspecified: Secondary | ICD-10-CM | POA: Diagnosis not present

## 2022-08-05 DIAGNOSIS — R0789 Other chest pain: Secondary | ICD-10-CM | POA: Diagnosis not present

## 2022-08-05 DIAGNOSIS — F4320 Adjustment disorder, unspecified: Secondary | ICD-10-CM | POA: Diagnosis not present

## 2022-08-05 DIAGNOSIS — Z7289 Other problems related to lifestyle: Secondary | ICD-10-CM | POA: Diagnosis not present

## 2022-08-05 DIAGNOSIS — R457 State of emotional shock and stress, unspecified: Secondary | ICD-10-CM | POA: Diagnosis not present

## 2022-08-05 DIAGNOSIS — R9431 Abnormal electrocardiogram [ECG] [EKG]: Secondary | ICD-10-CM | POA: Diagnosis not present

## 2022-08-05 DIAGNOSIS — F419 Anxiety disorder, unspecified: Secondary | ICD-10-CM | POA: Diagnosis not present

## 2022-08-05 DIAGNOSIS — R079 Chest pain, unspecified: Secondary | ICD-10-CM | POA: Diagnosis not present

## 2022-08-05 DIAGNOSIS — Z20822 Contact with and (suspected) exposure to covid-19: Secondary | ICD-10-CM | POA: Diagnosis not present

## 2022-08-06 NOTE — Telephone Encounter (Signed)
Send message to alyssa just for [facility]

## 2022-08-07 DIAGNOSIS — F4323 Adjustment disorder with mixed anxiety and depressed mood: Secondary | ICD-10-CM | POA: Diagnosis not present

## 2022-08-13 DIAGNOSIS — F4323 Adjustment disorder with mixed anxiety and depressed mood: Secondary | ICD-10-CM | POA: Diagnosis not present

## 2022-08-22 DIAGNOSIS — F4323 Adjustment disorder with mixed anxiety and depressed mood: Secondary | ICD-10-CM | POA: Diagnosis not present

## 2022-09-06 ENCOUNTER — Telehealth: Payer: Self-pay | Admitting: Nurse Practitioner

## 2022-09-06 NOTE — Telephone Encounter (Signed)
Received disability paperwork again from [employer]. Per Corey Skains with their office, patient has requested longer leave of absence. I explained to her Yetta Flock has patient out of work until 10/11/22 and patient has been referred to psychiatrist for his work issues. She stated if patient is now with Dr. Nicolasa Ducking, she will need to completed paperwork. Per Nevin Bloodgood with Dr. Waylan Boga office, patient came in for his 08/01/22 appointment. When they explained he would owe payment for visit, he stopped filling out new patient paperwork and left. I discussed situation with Alyssa, she will not complete disability paperwork since patient did not follow through with psy referral-Toni

## 2022-09-06 NOTE — Telephone Encounter (Signed)
S/w Yancey Flemings w/ [employer]. Explained situation to her. She will notify examiner as to why provider will no longer complete disability paperwork-Toni

## 2022-09-09 DIAGNOSIS — F4323 Adjustment disorder with mixed anxiety and depressed mood: Secondary | ICD-10-CM | POA: Diagnosis not present

## 2022-09-10 ENCOUNTER — Telehealth: Payer: Self-pay | Admitting: Nurse Practitioner

## 2022-09-10 MED ORDER — ALPRAZOLAM 0.25 MG PO TABS: 0.2500 mg | ORAL_TABLET | Freq: Two times a day (BID) | ORAL | 0 refills | Status: DC | PRN

## 2022-09-11 NOTE — Telephone Encounter (Signed)
Patient notified

## 2022-09-16 ENCOUNTER — Encounter: Payer: Self-pay | Admitting: Nurse Practitioner

## 2022-09-16 ENCOUNTER — Ambulatory Visit (INDEPENDENT_AMBULATORY_CARE_PROVIDER_SITE_OTHER): Payer: BC Managed Care – PPO | Admitting: Nurse Practitioner

## 2022-09-16 VITALS — BP 115/80 | HR 94 | Temp 97.5°F | Resp 16 | Ht 70.0 in | Wt 250.2 lb

## 2022-09-16 DIAGNOSIS — R079 Chest pain, unspecified: Secondary | ICD-10-CM

## 2022-09-16 DIAGNOSIS — F411 Generalized anxiety disorder: Secondary | ICD-10-CM

## 2022-09-16 MED ORDER — ALPRAZOLAM 0.25 MG PO TABS: 0.2500 mg | ORAL_TABLET | Freq: Two times a day (BID) | ORAL | 2 refills | Status: AC | PRN

## 2022-09-16 NOTE — Progress Notes (Signed)
[facility] [place], [place] [address]  Internal MEDICINE  Office Visit Note  Patient Name: Noah Cummings  [id number]  [id number]  Date of Service: 09/16/2022  Chief Complaint  Patient presents with   Hyperlipidemia   Follow-up    HPI Noah Cummings presents for a follow-up visit for  Working with a therapist and taljking abouit coping strategies and wokring on how to confront the situation     Current Medication: Outpatient Encounter Medications as of 09/16/2022  Medication Sig   [DISCONTINUED] ALPRAZolam (XANAX) 0.25 MG tablet Take 1 tablet (0.25 mg total) by mouth 2 (two) times daily as needed (severe anxiety/panic).   ALPRAZolam (XANAX) 0.25 MG tablet Take 1 tablet (0.25 mg total) by mouth 2 (two) times daily as needed (severe anxiety/panic).   No facility-administered encounter medications on file as of 09/16/2022.    Surgical History: Past Surgical History:  Procedure Laterality Date   none      Medical History: Past Medical History:  Diagnosis Date   Hyperlipidemia    Sleep disorder     Family History: Family History  Problem Relation Age of Onset   Diabetes Mother    Thyroid disease Mother    Prostate cancer Maternal Uncle    Diabetes Maternal Grandfather    Bladder Cancer Neg Hx    Kidney cancer Neg Hx     Social History   Socioeconomic History   Marital status: Married    Spouse name: Not on file   Number of children: Not on file   Years of education: Not on file   Highest education level: Not on file  Occupational History   Not on file  Tobacco Use   Smoking status: Never   Smokeless tobacco: Never  Vaping Use   Vaping Use: Never used  Substance and Sexual Activity   Alcohol use: No   Drug use: No   Sexual activity: Yes  Other Topics Concern   Not on file  Social History Narrative   Not on file   Social Determinants of Health   Financial Resource Strain: Not on file  Food Insecurity: Not on file   Transportation Needs: Not on file  Physical Activity: Not on file  Stress: Not on file  Social Connections: Not on file  Intimate Partner Violence: Not on file      Review of Systems  Vital Signs: BP 115/80   Pulse 94   Temp (!) 97.5 F (36.4 C)   Resp 16   Ht 5\' 10"  (1.778 m)   Wt 250 lb 3.2 oz (113.5 kg)   SpO2 96%   BMI 35.90 kg/m    Physical Exam     Assessment/Plan:   General Counseling: Noah Cummings verbalizes understanding of the findings of todays visit and agrees with plan of treatment. I have discussed any further diagnostic evaluation that may be needed or ordered today. We also reviewed his medications today. he has been encouraged to call the office with any questions or concerns that should arise related to todays visit.    No orders of the defined types were placed in this encounter.   Meds ordered this encounter  Medications   ALPRAZolam (XANAX) 0.25 MG tablet    Sig: Take 1 tablet (0.25 mg total) by mouth 2 (two) times daily as needed (severe anxiety/panic).    Dispense:  60 tablet    Refill:  2    For next fill    Return in about 3 months (around  12/10/2022) for F/U, anxiety med refill, Noah Cummings PCP.   Total time spent:*** Minutes Time spent includes review of chart, medications, test results, and follow up plan with the patient.   [place] Controlled Substance Database was reviewed by me.  This patient was seen by Noah Osgood, FNP-C in collaboration with Dr. Clayborn Cummings as a part of collaborative care agreement.   Noah Denz R. Valetta Fuller, MSN, FNP-C Internal medicine

## 2022-09-18 ENCOUNTER — Encounter: Payer: Self-pay | Admitting: Nurse Practitioner

## 2022-09-18 DIAGNOSIS — F4323 Adjustment disorder with mixed anxiety and depressed mood: Secondary | ICD-10-CM | POA: Diagnosis not present

## 2022-12-05 ENCOUNTER — Ambulatory Visit: Payer: Medicaid Other | Admitting: Nurse Practitioner

## 2023-06-04 ENCOUNTER — Encounter: Payer: Medicaid Other | Admitting: Nurse Practitioner
